# Patient Record
Sex: Female | Born: 1985 | Race: Black or African American | Hispanic: No | Marital: Married | Smoking: Never smoker
Health system: Southern US, Community
[De-identification: ages and names within clinical notes are randomized; demographics above are authoritative.]

## PROBLEM LIST (undated history)

## (undated) ENCOUNTER — Inpatient Hospital Stay (HOSPITAL_COMMUNITY): Payer: Self-pay

## (undated) DIAGNOSIS — F32A Depression, unspecified: Secondary | ICD-10-CM

## (undated) DIAGNOSIS — Z789 Other specified health status: Secondary | ICD-10-CM

## (undated) DIAGNOSIS — F329 Major depressive disorder, single episode, unspecified: Secondary | ICD-10-CM

## (undated) DIAGNOSIS — F419 Anxiety disorder, unspecified: Secondary | ICD-10-CM

## (undated) HISTORY — DX: Depression, unspecified: F32.A

## (undated) HISTORY — DX: Major depressive disorder, single episode, unspecified: F32.9

## (undated) HISTORY — PX: WISDOM TOOTH EXTRACTION: SHX21

## (undated) HISTORY — DX: Anxiety disorder, unspecified: F41.9

---

## 2009-12-06 ENCOUNTER — Emergency Department (HOSPITAL_COMMUNITY): Admission: EM | Admit: 2009-12-06 | Discharge: 2009-12-06 | Payer: Self-pay | Admitting: Emergency Medicine

## 2011-02-25 ENCOUNTER — Emergency Department (HOSPITAL_COMMUNITY)
Admission: EM | Admit: 2011-02-25 | Discharge: 2011-02-25 | Disposition: A | Discharge status: Home / Self Care | Payer: Self-pay | Attending: Emergency Medicine | Admitting: Emergency Medicine

## 2011-02-25 DIAGNOSIS — Z711 Person with feared health complaint in whom no diagnosis is made: Secondary | ICD-10-CM | POA: Insufficient documentation

## 2012-06-05 DIAGNOSIS — IMO0002 Reserved for concepts with insufficient information to code with codable children: Secondary | ICD-10-CM | POA: Insufficient documentation

## 2012-11-08 ENCOUNTER — Encounter (HOSPITAL_COMMUNITY): Payer: Self-pay | Admitting: *Deleted

## 2012-11-08 ENCOUNTER — Inpatient Hospital Stay (HOSPITAL_COMMUNITY)
Admission: AD | Admit: 2012-11-08 | Discharge: 2012-11-08 | Disposition: A | Discharge status: Home / Self Care | Payer: BC Managed Care – PPO | Source: Ambulatory Visit | Attending: Obstetrics & Gynecology | Admitting: Obstetrics & Gynecology

## 2012-11-08 DIAGNOSIS — O1203 Gestational edema, third trimester: Secondary | ICD-10-CM

## 2012-11-08 DIAGNOSIS — IMO0002 Reserved for concepts with insufficient information to code with codable children: Secondary | ICD-10-CM

## 2012-11-08 HISTORY — DX: Other specified health status: Z78.9

## 2012-11-08 LAB — URINALYSIS, ROUTINE W REFLEX MICROSCOPIC
Ketones, ur: NEGATIVE mg/dL
Leukocytes, UA: NEGATIVE
Nitrite: NEGATIVE
Protein, ur: NEGATIVE mg/dL
Urobilinogen, UA: 0.2 mg/dL (ref 0.0–1.0)
pH: 6 (ref 5.0–8.0)

## 2012-11-08 NOTE — MAU Note (Signed)
Pt reports she has had swelling in her feet x 2 weeks, feels like her face is swollen, denies problems with pregnancy

## 2012-11-08 NOTE — Progress Notes (Signed)
Pt states she took medication when she was"a little girl"

## 2012-11-08 NOTE — MAU Provider Note (Signed)
History     CSN: [id number]  Arrival date and time: 11/08/12 2003   None     Chief Complaint  Patient presents with  . Foot Swelling   HPI 27 y/o G1P0 here with facial and LE edema for 2-3 hours. She states that tonight she was in class and was told by a classmate that her face was swollen. She has had LE edema throughout her pregnancy that resolves with putting up her feet. SHe has not had facial edema. She describes it as uniform and feeling like her face was very tight. SHe denies any new foods, makeup, or medications. The swelling has now resolved. Her preg Hx is significant for IUGR and she normally gets her care at [facility]. She gets twice weekly NST's  And BPP q 3 weeks because of the IUGR. She was mostly worried about her baby. SHe denies vaginal bleeding, discharge, LOF, and decreased featal movement. She further denies dyspnea, fever, rash, itching, and chest pain.   OB History   Grav Para Term Preterm Abortions TAB SAB Ect Mult Living   1               Past Medical History  Diagnosis Date  . Medical history non-contributory     Past Surgical History  Procedure Laterality Date  . No past surgeries      Family History  Problem Relation Age of Onset  . Alcohol abuse Neg Hx   . Arthritis Neg Hx   . Asthma Neg Hx   . Birth defects Neg Hx   . Cancer Neg Hx   . COPD Neg Hx   . Depression Neg Hx   . Diabetes Neg Hx   . Drug abuse Neg Hx   . Early death Neg Hx   . Hearing loss Neg Hx   . Heart disease Neg Hx   . Hyperlipidemia Neg Hx   . Hypertension Neg Hx   . Kidney disease Neg Hx   . Learning disabilities Neg Hx   . Mental illness Neg Hx   . Mental retardation Neg Hx   . Miscarriages / Stillbirths Neg Hx   . Stroke Neg Hx   . Vision loss Neg Hx     History  Substance Use Topics  . Smoking status: Not on file  . Smokeless tobacco: Not on file  . Alcohol Use: Not on file    Allergies:  Allergies  Allergen Reactions  .  Doxycycline Itching and Rash    Prescriptions prior to admission  Medication Sig Dispense Refill  . Prenatal Vit-Fe Fumarate-FA (PRENATAL MULTIVITAMIN) TABS Take 1 tablet by mouth daily.        ROS Per HPI Physical Exam   Blood pressure 119/78, pulse 76, temperature 97.6 F (36.4 C), temperature source Oral, resp. rate 18, height 5\' 7"  (1.702 m), weight 86.183 kg (190 lb), SpO2 100.00%.  Physical Exam Gen: NAD, alert, cooperative with exam HEENT: NCAT, EOMI, PERRL CV: RRR, good S1/S2, no murmur Resp: CTABL, no wheezes, non-labored Abd: Soft, pregnant abdomen Ext: trace LE edemaBL Neuro: Alert and oriented, No gross deficits  FHT: BAseline 140, Moderate variability, accels present, No decels Toco: few irregular contractions   MAU Course  Procedures  Results for orders placed during the hospital encounter of 11/08/12 (from the past 24 hour(s))  URINALYSIS, ROUTINE W REFLEX MICROSCOPIC     Status: Abnormal   Collection Time    11/08/12  8:29 PM  Result Value Range   Color, Urine YELLOW  YELLOW   APPearance CLEAR  CLEAR   Specific Gravity, Urine <1.005 (*) 1.005 - 1.030   pH 6.0  5.0 - 8.0   Glucose, UA NEGATIVE  NEGATIVE mg/dL   Hgb urine dipstick NEGATIVE  NEGATIVE   Bilirubin Urine NEGATIVE  NEGATIVE   Ketones, ur NEGATIVE  NEGATIVE mg/dL   Protein, ur NEGATIVE  NEGATIVE mg/dL   Urobilinogen, UA 0.2  0.0 - 1.0 mg/dL   Nitrite NEGATIVE  NEGATIVE   Leukocytes, UA NEGATIVE  NEGATIVE    Assessment and Plan  27 y/o G1P0 here with transient facial and LE swelling - with Normal BP, UA without protein, and improvement of symptoms without intervention she is safe for dc with close f/u as previously scheduled - Consider possible food allergy with transient facial swelling - f/u with her primary OB in [place]   Kevin Fenton 11/08/2012, 10:37 PM

## 2012-11-08 NOTE — MAU Note (Signed)
Pt states she noticed swelling about 1800 pt states she elevated her legs for about and they didn't go down.

## 2012-11-10 NOTE — MAU Provider Note (Signed)
I have seen and examined this patient and agree the above assessment. CRESENZO-DISHMAN,Oni Dietzman 11/10/2012 12:12 PM   

## 2012-11-22 DIAGNOSIS — O1490 Unspecified pre-eclampsia, unspecified trimester: Secondary | ICD-10-CM

## 2013-09-13 ENCOUNTER — Encounter (HOSPITAL_COMMUNITY): Payer: Self-pay | Admitting: *Deleted

## 2014-07-15 ENCOUNTER — Encounter (HOSPITAL_COMMUNITY): Payer: Self-pay | Admitting: *Deleted

## 2015-03-03 DIAGNOSIS — Z98891 History of uterine scar from previous surgery: Secondary | ICD-10-CM | POA: Insufficient documentation

## 2015-03-03 DIAGNOSIS — O09299 Supervision of pregnancy with other poor reproductive or obstetric history, unspecified trimester: Secondary | ICD-10-CM | POA: Insufficient documentation

## 2015-03-03 DIAGNOSIS — Z8619 Personal history of other infectious and parasitic diseases: Secondary | ICD-10-CM | POA: Insufficient documentation

## 2015-03-03 LAB — OB RESULTS CONSOLE HGB/HCT, BLOOD
HEMATOCRIT: 35 %
HEMOGLOBIN: 11.7 g/dL

## 2015-03-03 LAB — OB RESULTS CONSOLE HIV ANTIBODY (ROUTINE TESTING): HIV: NONREACTIVE

## 2015-03-03 LAB — OB RESULTS CONSOLE PLATELET COUNT: Platelets: 251 10*3/uL

## 2015-03-03 LAB — OB RESULTS CONSOLE ABO/RH: RH TYPE: POSITIVE

## 2015-03-03 LAB — OB RESULTS CONSOLE ANTIBODY SCREEN: ANTIBODY SCREEN: NEGATIVE

## 2015-03-03 LAB — OB RESULTS CONSOLE RUBELLA ANTIBODY, IGM: Rubella: IMMUNE

## 2015-03-03 LAB — OB RESULTS CONSOLE RPR: RPR: NONREACTIVE

## 2015-03-03 LAB — OB RESULTS CONSOLE HEPATITIS B SURFACE ANTIGEN: HEP B S AG: NEGATIVE

## 2015-03-24 ENCOUNTER — Encounter: Payer: Self-pay | Admitting: Obstetrics & Gynecology

## 2015-03-24 DIAGNOSIS — Z3480 Encounter for supervision of other normal pregnancy, unspecified trimester: Secondary | ICD-10-CM

## 2015-03-26 LAB — OB RESULTS CONSOLE GC/CHLAMYDIA
Chlamydia: NEGATIVE
Gonorrhea: NEGATIVE

## 2015-07-16 LAB — GLUCOSE TOLERANCE, 1 HOUR: Glucose, GTT - 1 Hour: 80 mg/dL (ref ?–200)

## 2015-07-16 LAB — OB RESULTS CONSOLE PLATELET COUNT: PLATELETS: 197 10*3/uL

## 2015-07-16 LAB — OB RESULTS CONSOLE HGB/HCT, BLOOD
HEMATOCRIT: 36 %
HEMOGLOBIN: 11.8 g/dL

## 2015-07-16 LAB — OB RESULTS CONSOLE HIV ANTIBODY (ROUTINE TESTING): HIV: NONREACTIVE

## 2015-07-16 LAB — OB RESULTS CONSOLE RPR: RPR: NONREACTIVE

## 2015-07-24 DIAGNOSIS — O365990 Maternal care for other known or suspected poor fetal growth, unspecified trimester, not applicable or unspecified: Secondary | ICD-10-CM | POA: Insufficient documentation

## 2015-08-15 ENCOUNTER — Other Ambulatory Visit (HOSPITAL_COMMUNITY): Payer: Self-pay | Admitting: Nurse Practitioner

## 2015-08-15 DIAGNOSIS — Z3A30 30 weeks gestation of pregnancy: Secondary | ICD-10-CM

## 2015-08-15 DIAGNOSIS — IMO0002 Reserved for concepts with insufficient information to code with codable children: Secondary | ICD-10-CM

## 2015-08-19 ENCOUNTER — Other Ambulatory Visit (HOSPITAL_COMMUNITY): Payer: Self-pay | Admitting: *Deleted

## 2015-08-19 DIAGNOSIS — IMO0002 Reserved for concepts with insufficient information to code with codable children: Secondary | ICD-10-CM

## 2015-08-20 ENCOUNTER — Ambulatory Visit (HOSPITAL_COMMUNITY)
Admission: RE | Admit: 2015-08-20 | Discharge: 2015-08-20 | Disposition: A | Discharge status: Home / Self Care | Payer: Medicaid Other | Source: Ambulatory Visit | Attending: Obstetrics and Gynecology | Admitting: Obstetrics and Gynecology

## 2015-08-20 DIAGNOSIS — O09293 Supervision of pregnancy with other poor reproductive or obstetric history, third trimester: Secondary | ICD-10-CM | POA: Diagnosis not present

## 2015-08-20 DIAGNOSIS — O34219 Maternal care for unspecified type scar from previous cesarean delivery: Secondary | ICD-10-CM | POA: Diagnosis not present

## 2015-08-20 DIAGNOSIS — Z3A30 30 weeks gestation of pregnancy: Secondary | ICD-10-CM | POA: Diagnosis not present

## 2015-08-20 DIAGNOSIS — O365930 Maternal care for other known or suspected poor fetal growth, third trimester, not applicable or unspecified: Secondary | ICD-10-CM | POA: Diagnosis not present

## 2015-08-20 MED ORDER — BETAMETHASONE SOD PHOS & ACET 6 (3-3) MG/ML IJ SUSP
12.0000 mg | Freq: Once | INTRAMUSCULAR | Status: AC
  Administered 2015-08-20: 12 mg via INTRAMUSCULAR
  Filled 2015-08-20: qty 2

## 2015-08-21 ENCOUNTER — Other Ambulatory Visit (HOSPITAL_COMMUNITY): Payer: Self-pay | Admitting: Nurse Practitioner

## 2015-08-21 ENCOUNTER — Encounter (HOSPITAL_COMMUNITY): Payer: Self-pay

## 2015-08-21 ENCOUNTER — Other Ambulatory Visit (HOSPITAL_COMMUNITY): Payer: Self-pay | Admitting: Obstetrics and Gynecology

## 2015-08-21 ENCOUNTER — Ambulatory Visit (HOSPITAL_COMMUNITY)
Admission: RE | Admit: 2015-08-21 | Discharge: 2015-08-21 | Disposition: A | Discharge status: Home / Self Care | Payer: Medicaid Other | Source: Ambulatory Visit | Attending: Nurse Practitioner | Admitting: Nurse Practitioner

## 2015-08-21 ENCOUNTER — Other Ambulatory Visit (HOSPITAL_COMMUNITY): Payer: Self-pay

## 2015-08-21 DIAGNOSIS — O34219 Maternal care for unspecified type scar from previous cesarean delivery: Secondary | ICD-10-CM

## 2015-08-21 DIAGNOSIS — IMO0002 Reserved for concepts with insufficient information to code with codable children: Secondary | ICD-10-CM

## 2015-08-21 DIAGNOSIS — O365930 Maternal care for other known or suspected poor fetal growth, third trimester, not applicable or unspecified: Secondary | ICD-10-CM | POA: Diagnosis not present

## 2015-08-21 DIAGNOSIS — Z3A30 30 weeks gestation of pregnancy: Secondary | ICD-10-CM

## 2015-08-21 DIAGNOSIS — O09299 Supervision of pregnancy with other poor reproductive or obstetric history, unspecified trimester: Secondary | ICD-10-CM

## 2015-08-21 DIAGNOSIS — Z8489 Family history of other specified conditions: Secondary | ICD-10-CM

## 2015-08-28 ENCOUNTER — Encounter (HOSPITAL_COMMUNITY): Payer: Self-pay

## 2015-08-28 ENCOUNTER — Ambulatory Visit (HOSPITAL_COMMUNITY)
Admission: RE | Admit: 2015-08-28 | Discharge: 2015-08-28 | Disposition: A | Discharge status: Home / Self Care | Payer: Medicaid Other | Source: Ambulatory Visit | Attending: Nurse Practitioner | Admitting: Nurse Practitioner

## 2015-08-28 ENCOUNTER — Other Ambulatory Visit (HOSPITAL_COMMUNITY): Payer: Self-pay | Admitting: Maternal and Fetal Medicine

## 2015-08-28 DIAGNOSIS — O09299 Supervision of pregnancy with other poor reproductive or obstetric history, unspecified trimester: Secondary | ICD-10-CM

## 2015-08-28 DIAGNOSIS — Z3A31 31 weeks gestation of pregnancy: Secondary | ICD-10-CM

## 2015-08-28 DIAGNOSIS — O365930 Maternal care for other known or suspected poor fetal growth, third trimester, not applicable or unspecified: Secondary | ICD-10-CM | POA: Diagnosis present

## 2015-08-28 DIAGNOSIS — IMO0002 Reserved for concepts with insufficient information to code with codable children: Secondary | ICD-10-CM

## 2015-08-28 DIAGNOSIS — Z8489 Family history of other specified conditions: Secondary | ICD-10-CM

## 2015-08-28 DIAGNOSIS — Z8759 Personal history of other complications of pregnancy, childbirth and the puerperium: Secondary | ICD-10-CM

## 2015-08-28 DIAGNOSIS — O34219 Maternal care for unspecified type scar from previous cesarean delivery: Secondary | ICD-10-CM

## 2015-08-28 DIAGNOSIS — O09293 Supervision of pregnancy with other poor reproductive or obstetric history, third trimester: Secondary | ICD-10-CM | POA: Insufficient documentation

## 2015-09-04 ENCOUNTER — Other Ambulatory Visit (HOSPITAL_COMMUNITY): Payer: Self-pay | Admitting: Maternal and Fetal Medicine

## 2015-09-04 ENCOUNTER — Ambulatory Visit (HOSPITAL_COMMUNITY)
Admission: RE | Admit: 2015-09-04 | Discharge: 2015-09-04 | Disposition: A | Discharge status: Home / Self Care | Payer: Medicaid Other | Source: Ambulatory Visit | Attending: Nurse Practitioner | Admitting: Nurse Practitioner

## 2015-09-04 ENCOUNTER — Encounter (HOSPITAL_COMMUNITY): Payer: Self-pay

## 2015-09-04 DIAGNOSIS — O365930 Maternal care for other known or suspected poor fetal growth, third trimester, not applicable or unspecified: Secondary | ICD-10-CM

## 2015-09-04 DIAGNOSIS — O09293 Supervision of pregnancy with other poor reproductive or obstetric history, third trimester: Secondary | ICD-10-CM | POA: Insufficient documentation

## 2015-09-04 DIAGNOSIS — IMO0002 Reserved for concepts with insufficient information to code with codable children: Secondary | ICD-10-CM

## 2015-09-04 DIAGNOSIS — Z3A32 32 weeks gestation of pregnancy: Secondary | ICD-10-CM | POA: Diagnosis not present

## 2015-09-04 DIAGNOSIS — O34219 Maternal care for unspecified type scar from previous cesarean delivery: Secondary | ICD-10-CM

## 2015-09-04 DIAGNOSIS — Z8489 Family history of other specified conditions: Secondary | ICD-10-CM

## 2015-09-11 ENCOUNTER — Encounter (HOSPITAL_COMMUNITY): Payer: Self-pay

## 2015-09-11 ENCOUNTER — Ambulatory Visit (HOSPITAL_COMMUNITY)
Admission: RE | Admit: 2015-09-11 | Discharge: 2015-09-11 | Disposition: A | Discharge status: Home / Self Care | Payer: Medicaid Other | Source: Ambulatory Visit | Attending: Obstetrics & Gynecology | Admitting: Obstetrics & Gynecology

## 2015-09-11 ENCOUNTER — Other Ambulatory Visit (HOSPITAL_COMMUNITY): Payer: Self-pay | Admitting: Maternal and Fetal Medicine

## 2015-09-11 DIAGNOSIS — O09293 Supervision of pregnancy with other poor reproductive or obstetric history, third trimester: Secondary | ICD-10-CM

## 2015-09-11 DIAGNOSIS — Z3A33 33 weeks gestation of pregnancy: Secondary | ICD-10-CM | POA: Diagnosis not present

## 2015-09-11 DIAGNOSIS — Z8489 Family history of other specified conditions: Secondary | ICD-10-CM

## 2015-09-11 DIAGNOSIS — O34219 Maternal care for unspecified type scar from previous cesarean delivery: Secondary | ICD-10-CM | POA: Diagnosis not present

## 2015-09-11 DIAGNOSIS — IMO0002 Reserved for concepts with insufficient information to code with codable children: Secondary | ICD-10-CM

## 2015-09-11 DIAGNOSIS — O365930 Maternal care for other known or suspected poor fetal growth, third trimester, not applicable or unspecified: Secondary | ICD-10-CM | POA: Insufficient documentation

## 2015-09-14 NOTE — L&D Delivery Note (Signed)
Delivery Note At  a non-viable unspecified sex was delivered via vaginal delivery.  Infant was vertex with a hand presentation.  APGAR: 0, 0. Weight: not weighted yet.   Placenta status: placenta intact. No evidence of abruption.  Cord: infarcted.  Indeterminate number of vessels.  Cord pH: N/A  Anesthesia:  epidural Episiotomy:  none Lacerations:  none Est. Blood Loss (mL):  <50cc. Minimal blood loss with delivery of the infant.  No fluid noted at the time of delivery.  Mom to women's unit.  Baby to morgue after time with mother.Marland Kitchen  HARRAWAY-SMITH, Bellamy Rubey 09/30/2015, 6:49 PM

## 2015-09-18 ENCOUNTER — Ambulatory Visit (HOSPITAL_COMMUNITY): Payer: Medicaid Other

## 2015-09-24 ENCOUNTER — Encounter: Payer: Self-pay | Admitting: *Deleted

## 2015-09-24 DIAGNOSIS — Z8659 Personal history of other mental and behavioral disorders: Secondary | ICD-10-CM

## 2015-09-24 DIAGNOSIS — Z8759 Personal history of other complications of pregnancy, childbirth and the puerperium: Principal | ICD-10-CM

## 2015-09-24 NOTE — Progress Notes (Signed)
Chart abstracted. [facility]

## 2015-09-25 ENCOUNTER — Ambulatory Visit (HOSPITAL_COMMUNITY)
Admission: RE | Admit: 2015-09-25 | Discharge: 2015-09-25 | Disposition: A | Discharge status: Home / Self Care | Payer: BC Managed Care – PPO | Source: Ambulatory Visit | Attending: Obstetrics & Gynecology | Admitting: Obstetrics & Gynecology

## 2015-09-25 ENCOUNTER — Encounter (HOSPITAL_COMMUNITY): Payer: Self-pay

## 2015-09-25 ENCOUNTER — Encounter: Payer: Self-pay | Admitting: Family Medicine

## 2015-09-25 ENCOUNTER — Encounter: Payer: Self-pay | Admitting: Obstetrics & Gynecology

## 2015-09-25 ENCOUNTER — Ambulatory Visit (INDEPENDENT_AMBULATORY_CARE_PROVIDER_SITE_OTHER): Payer: Medicaid Other | Admitting: Obstetrics & Gynecology

## 2015-09-25 VITALS — BP 115/72 | HR 80 | Temp 98.1°F | Ht 66.0 in | Wt 188.0 lb

## 2015-09-25 DIAGNOSIS — O365930 Maternal care for other known or suspected poor fetal growth, third trimester, not applicable or unspecified: Secondary | ICD-10-CM

## 2015-09-25 DIAGNOSIS — O0990 Supervision of high risk pregnancy, unspecified, unspecified trimester: Secondary | ICD-10-CM | POA: Insufficient documentation

## 2015-09-25 DIAGNOSIS — Z3A35 35 weeks gestation of pregnancy: Secondary | ICD-10-CM | POA: Diagnosis not present

## 2015-09-25 DIAGNOSIS — IMO0002 Reserved for concepts with insufficient information to code with codable children: Secondary | ICD-10-CM

## 2015-09-25 DIAGNOSIS — O09293 Supervision of pregnancy with other poor reproductive or obstetric history, third trimester: Secondary | ICD-10-CM | POA: Insufficient documentation

## 2015-09-25 DIAGNOSIS — O0993 Supervision of high risk pregnancy, unspecified, third trimester: Secondary | ICD-10-CM

## 2015-09-25 DIAGNOSIS — O34219 Maternal care for unspecified type scar from previous cesarean delivery: Secondary | ICD-10-CM | POA: Insufficient documentation

## 2015-09-25 LAB — POCT URINALYSIS DIP (DEVICE)
Bilirubin Urine: NEGATIVE
Glucose, UA: NEGATIVE mg/dL
Hgb urine dipstick: NEGATIVE
Ketones, ur: 15 mg/dL — AB
NITRITE: NEGATIVE
PH: 5.5 (ref 5.0–8.0)
PROTEIN: NEGATIVE mg/dL
Specific Gravity, Urine: 1.02 (ref 1.005–1.030)
Urobilinogen, UA: 0.2 mg/dL (ref 0.0–1.0)
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
## 2015-09-25 NOTE — Patient Instructions (Signed)
Vaginal Birth After Cesarean Delivery  Vaginal birth after cesarean delivery (VBAC) is giving birth vaginally after previously delivering a baby by a cesarean. In the past, if a woman had a cesarean delivery, all births afterward would be done by cesarean delivery. This is no longer true. It can be safe for the mother to try a vaginal delivery after having a cesarean delivery.   It is important to discuss VBAC with your health care provider early in the pregnancy so you can understand the risks, benefits, and options. It will give you time to decide what is best in your particular case. The final decision about whether to have a VBAC or repeat cesarean delivery should be between you and your health care provider. Any changes in your health or your baby's health during your pregnancy may make it necessary to change your initial decision about VBAC.   WOMEN WHO PLAN TO HAVE A VBAC SHOULD CHECK WITH THEIR HEALTH CARE PROVIDER TO BE SURE THAT:  · The previous cesarean delivery was done with a low transverse uterine cut (incision) (not a vertical classical incision).    · The birth canal is big enough for the baby.    · There were no other operations on the uterus.    · An electronic fetal monitor (EFM) will be on at all times during labor.    · An operating room will be available and ready in case an emergency cesarean delivery is needed.    · A health care provider and surgical nursing staff will be available at all times during labor to be ready to do an emergency delivery cesarean if necessary.    · An anesthesiologist will be present in case an emergency cesarean delivery is needed.    · The nursery is prepared and has adequate personnel and necessary equipment available to care for the baby in case of an emergency cesarean delivery.  BENEFITS OF VBAC  · Shorter stay in the hospital.    · Avoidance of risks associated with cesarean delivery, such as:    Surgical complications, such as opening of the incision or  hernia in the incision.    Injury to other organs.    Fever. This can occur if an infection develops after surgery. It can also occur as a reaction to the medicine given to make you numb during the surgery.  · Less blood loss and need for blood transfusions.  · Lower risk of blood clots and infection.   · Shorter recovery.    · Decreased risk for having to remove the uterus (hysterectomy).    · Decreased risk for the placenta to completely or partially cover the opening of the uterus (placenta previa) with a future pregnancy.    · Decrease risk in future labor and delivery.  RISKS OF A VBAC  · Tearing (rupture) of the uterus. This is occurs in less than 1% of VBACs. The risk of this happening is higher if:    Steps are taken to begin the labor process (induce labor) or stimulate or strengthen contractions (augment labor).      Medicine is used to soften (ripen) the cervix.  · Having to remove the uterus (hysterectomy) if it ruptures.  VBAC SHOULD NOT BE DONE IF:  · The previous cesarean delivery was done with a vertical (classical) or T-shaped incision or you do not know what kind of incision was made.    · You had a ruptured uterus.    · You have had certain types of surgery on your uterus, such as removal of uterine fibroids.   Ask your health care provider about other types of surgeries that prevent you from having a VBAC.  · You have certain medical or childbirth (obstetrical) problems.    · There are problems with the baby.    · You have had two previous cesarean deliveries and no vaginal deliveries.  OTHER FACTS TO KNOW ABOUT VBAC:  · It is safe to have an epidural anesthetic with VBAC.    · It is safe to turn the baby from a breech position (attempt an external cephalic version).    · It is safe to try a VBAC with twins.    · VBAC may not be successful if your baby weights 8.8 lb (4 kg) or more. However, weight predictions are not always accurate and should not be used alone to decide if VBAC is right for  you.  · There is an increased failure rate if the time between the cesarean delivery and VBAC is less than 19 months.    · Your health care provider may advise against a VBAC if you have preeclampsia (high blood pressure, protein in the urine, and swelling of face and extremities).    · VBAC is often successful if you previously gave birth vaginally.    · VBAC is often successful when the labor starts spontaneously before the due date.    · Delivering a baby through a VBAC is similar to having a normal spontaneous vaginal delivery.     This information is not intended to replace advice given to you by your health care provider. Make sure you discuss any questions you have with your health care provider.     Document Released: 02/20/2007 Document Revised: 09/20/2014 Document Reviewed: 03/29/2013  Elsevier Interactive Patient Education ©2016 Elsevier Inc.

## 2015-09-26 ENCOUNTER — Encounter: Payer: Self-pay | Admitting: *Deleted

## 2015-09-26 ENCOUNTER — Telehealth: Payer: Self-pay | Admitting: *Deleted

## 2015-09-26 DIAGNOSIS — O0990 Supervision of high risk pregnancy, unspecified, unspecified trimester: Secondary | ICD-10-CM

## 2015-09-26 DIAGNOSIS — Z8659 Personal history of other mental and behavioral disorders: Secondary | ICD-10-CM

## 2015-09-26 DIAGNOSIS — Z8759 Personal history of other complications of pregnancy, childbirth and the puerperium: Principal | ICD-10-CM

## 2015-09-26 DIAGNOSIS — Z820 Family history of epilepsy and other diseases of the nervous system: Secondary | ICD-10-CM | POA: Insufficient documentation

## 2015-09-26 NOTE — Telephone Encounter (Signed)
Pt left message stating that she was @ clinic yesterday and saw Dr. Debroah LoopArnold. He told her that she could see midwife for prenatal care but when she called [place] office to schedule appt, she was told she could not be seen there. She is confused and would like a call back. I called pt back and discussed that she has a high risk pregnancy and that may be the reason she cannot be [place] @ [place]. I further stated that we have a midwife in our clinic on the days of her next 3 weekly US @ [facility]. She may see the midwife in our office on that Rachel Savage. She will need to see a doctor @ her 2/9 visit as there is no midwife in clinic that Rachel Savage. Pt was very appreciative and agreed to the plan for appts as stated. She requested to see a female provider @ her visit on 2/9 and I stated that her request would be honored. [facility] registrar to schedule appts.

## 2015-09-28 ENCOUNTER — Inpatient Hospital Stay (HOSPITAL_COMMUNITY)
Admission: AD | Admit: 2015-09-28 | Discharge: 2015-10-02 | DRG: 774 | Disposition: A | Discharge status: Home / Self Care | Payer: BC Managed Care – PPO | Source: Ambulatory Visit | Attending: Family Medicine | Admitting: Family Medicine

## 2015-09-28 ENCOUNTER — Encounter (HOSPITAL_COMMUNITY): Payer: Self-pay

## 2015-09-28 ENCOUNTER — Inpatient Hospital Stay (HOSPITAL_COMMUNITY): Payer: BC Managed Care – PPO

## 2015-09-28 DIAGNOSIS — O322XX0 Maternal care for transverse and oblique lie, not applicable or unspecified: Secondary | ICD-10-CM | POA: Diagnosis present

## 2015-09-28 DIAGNOSIS — O364XX0 Maternal care for intrauterine death, not applicable or unspecified: Secondary | ICD-10-CM | POA: Diagnosis present

## 2015-09-28 DIAGNOSIS — O34219 Maternal care for unspecified type scar from previous cesarean delivery: Secondary | ICD-10-CM | POA: Diagnosis present

## 2015-09-28 DIAGNOSIS — Z833 Family history of diabetes mellitus: Secondary | ICD-10-CM | POA: Diagnosis not present

## 2015-09-28 DIAGNOSIS — O09299 Supervision of pregnancy with other poor reproductive or obstetric history, unspecified trimester: Secondary | ICD-10-CM

## 2015-09-28 DIAGNOSIS — Z3A36 36 weeks gestation of pregnancy: Secondary | ICD-10-CM

## 2015-09-28 DIAGNOSIS — Z8659 Personal history of other mental and behavioral disorders: Secondary | ICD-10-CM

## 2015-09-28 DIAGNOSIS — Z3A35 35 weeks gestation of pregnancy: Secondary | ICD-10-CM

## 2015-09-28 DIAGNOSIS — O368190 Decreased fetal movements, unspecified trimester, not applicable or unspecified: Secondary | ICD-10-CM

## 2015-09-28 DIAGNOSIS — O365990 Maternal care for other known or suspected poor fetal growth, unspecified trimester, not applicable or unspecified: Secondary | ICD-10-CM | POA: Diagnosis present

## 2015-09-28 DIAGNOSIS — IMO0002 Reserved for concepts with insufficient information to code with codable children: Secondary | ICD-10-CM | POA: Diagnosis present

## 2015-09-28 DIAGNOSIS — Z8249 Family history of ischemic heart disease and other diseases of the circulatory system: Secondary | ICD-10-CM

## 2015-09-28 DIAGNOSIS — Z8759 Personal history of other complications of pregnancy, childbirth and the puerperium: Secondary | ICD-10-CM

## 2015-09-28 DIAGNOSIS — Z98891 History of uterine scar from previous surgery: Secondary | ICD-10-CM

## 2015-09-28 DIAGNOSIS — Z820 Family history of epilepsy and other diseases of the nervous system: Secondary | ICD-10-CM

## 2015-09-28 LAB — CBC
HEMATOCRIT: 36.8 % (ref 36.0–46.0)
Hemoglobin: 12.7 g/dL (ref 12.0–15.0)
MCH: 28.9 pg (ref 26.0–34.0)
MCHC: 34.5 g/dL (ref 30.0–36.0)
MCV: 83.6 fL (ref 78.0–100.0)
PLATELETS: 166 10*3/uL (ref 150–400)
RBC: 4.4 MIL/uL (ref 3.87–5.11)
RDW: 12.3 % (ref 11.5–15.5)
WBC: 5.4 10*3/uL (ref 4.0–10.5)

## 2015-09-28 LAB — KLEIHAUER-BETKE STAIN
# VIALS RHIG: 1
FETAL CELLS %: 0 %
QUANTITATION FETAL HEMOGLOBIN: 0 mL

## 2015-09-28 LAB — TYPE AND SCREEN
ABO/RH(D): O POS
Antibody Screen: NEGATIVE

## 2015-09-28 LAB — D-DIMER, QUANTITATIVE (NOT AT ARMC): D DIMER QUANT: 4.3 ug{FEU}/mL — AB (ref 0.00–0.50)

## 2015-09-28 MED ORDER — CITRIC ACID-SODIUM CITRATE 334-500 MG/5ML PO SOLN: 30.0000 mL | ORAL | Status: DC | PRN

## 2015-09-28 MED ORDER — OXYCODONE-ACETAMINOPHEN 5-325 MG PO TABS: 1.0000 | ORAL_TABLET | ORAL | Status: DC | PRN

## 2015-09-28 MED ORDER — LIDOCAINE HCL (PF) 1 % IJ SOLN: 30.0000 mL | INTRAMUSCULAR | Status: DC | PRN

## 2015-09-28 MED ORDER — SODIUM CHLORIDE 0.9 % IJ SOLN: 3.0000 mL | INTRAMUSCULAR | Status: DC | PRN

## 2015-09-28 MED ORDER — OXYTOCIN 10 UNIT/ML IJ SOLN: 10.0000 [IU] | Freq: Once | INTRAMUSCULAR | Status: DC

## 2015-09-28 MED ORDER — ONDANSETRON HCL 4 MG/2ML IJ SOLN
4.0000 mg | Freq: Four times a day (QID) | INTRAMUSCULAR | Status: DC | PRN
  Administered 2015-09-29 – 2015-09-30 (×2): 4 mg via INTRAVENOUS
  Filled 2015-09-28 (×2): qty 2

## 2015-09-28 MED ORDER — FENTANYL CITRATE (PF) 100 MCG/2ML IJ SOLN: 50.0000 ug | INTRAMUSCULAR | Status: DC | PRN

## 2015-09-28 MED ORDER — SODIUM CHLORIDE 0.9 % IV SOLN: 250.0000 mL | INTRAVENOUS | Status: DC | PRN

## 2015-09-28 MED ORDER — SODIUM CHLORIDE 0.9 % IJ SOLN: 3.0000 mL | Freq: Two times a day (BID) | INTRAMUSCULAR | Status: DC

## 2015-09-28 MED ORDER — OXYTOCIN 10 UNIT/ML IJ SOLN: 2.5000 [IU]/h | INTRAVENOUS | Status: DC

## 2015-09-28 MED ORDER — HYDROXYZINE HCL 50 MG PO TABS: 50.0000 mg | ORAL_TABLET | Freq: Four times a day (QID) | ORAL | Status: DC | PRN

## 2015-09-28 MED ORDER — MISOPROSTOL 50MCG HALF TABLET
50.0000 ug | ORAL_TABLET | ORAL | Status: DC
  Administered 2015-09-28 – 2015-09-30 (×7): 50 ug via ORAL
  Filled 2015-09-28 (×7): qty 0.5

## 2015-09-28 MED ORDER — ACETAMINOPHEN 325 MG PO TABS: 650.0000 mg | ORAL_TABLET | ORAL | Status: DC | PRN

## 2015-09-28 MED ORDER — LACTATED RINGERS IV SOLN
500.0000 mL | INTRAVENOUS | Status: DC | PRN
  Administered 2015-09-30: 500 mL via INTRAVENOUS

## 2015-09-28 MED ORDER — OXYCODONE-ACETAMINOPHEN 5-325 MG PO TABS: 2.0000 | ORAL_TABLET | ORAL | Status: DC | PRN

## 2015-09-28 MED ORDER — LACTATED RINGERS IV SOLN
INTRAVENOUS | Status: DC
  Administered 2015-09-30: 125 mL/h via INTRAVENOUS

## 2015-09-28 MED ORDER — OXYTOCIN BOLUS FROM INFUSION
500.0000 mL | INTRAVENOUS | Status: DC
  Administered 2015-09-30: 500 mL via INTRAVENOUS

## 2015-09-28 NOTE — H&P (Signed)
Rachel Savage is a 30 y.o. female G3P0111 @ 35.5 wks [facility] presenting for IUFD. Maternal Medical History:  Reason for admission: IUFD  Contractions: none  Prenatal complications: no prenatal complications Prenatal Complications - Diabetes: none.    OB History    Gravida Para Term Preterm AB TAB SAB Ectopic Multiple Living   3 1 0 1 1 0 1 0 0 1      Past Medical History  Diagnosis Date  . Medical history non-contributory   . Depression    Past Surgical History  Procedure Laterality Date  . Cesarean section    . Wisdom tooth extraction     Family History: family history includes Cancer in her maternal uncle; Diabetes in her maternal grandmother; Hypertension in her father and maternal aunt; Muscular dystrophy in her maternal uncle. Social History:  reports that she has never smoked. She has never used smokeless tobacco. She reports that she does not drink alcohol or use illicit drugs.   Prenatal Transfer Tool  Maternal Diabetes: No Genetic Screening: Normal Maternal Ultrasounds/Referrals: Normal Fetal Ultrasounds or other Referrals:  None Maternal Substance Abuse:  No Significant Maternal Medications:  None Significant Maternal Lab Results:  None Other Comments:  None  Review of Systems  Constitutional: Negative.   HENT: Negative.   Eyes: Negative.   Respiratory: Negative.   Cardiovascular: Negative.   Gastrointestinal: Negative.   Genitourinary: Negative.   Musculoskeletal: Negative.   Skin: Negative.   Neurological: Negative.   Endo/Heme/Allergies: Negative.   Psychiatric/Behavioral: Negative.     Dilation: Closed Exam by:: darlene lawson cnm Blood pressure 113/74, temperature 98.1 F (36.7 C), temperature source Oral, resp. rate 18, height 5\' 6"  (1.676 m), weight 188 lb (85.276 kg), last menstrual period 01/21/2015, unknown if currently breastfeeding. Maternal Exam:  Uterine Assessment: No contractions  Abdomen: Patient reports  no abdominal tenderness. Introitus: Normal vulva. Pelvis: adequate for delivery.   Cervix: Cervix evaluated by digital exam.     Fetal Exam Fetal Monitor Review: IUFD     Physical Exam  Constitutional: She is oriented to person, place, and time. She appears well-developed and well-nourished.  HENT:  Head: Normocephalic.  Neck: Normal range of motion.  Cardiovascular: Normal rate, regular rhythm, normal heart sounds and intact distal pulses.   Respiratory: Effort normal and breath sounds normal.  GI: Soft.  Genitourinary: Vagina normal.  Musculoskeletal: Normal range of motion.  Neurological: She is alert and oriented to person, place, and time. She has normal reflexes.  Skin: Skin is warm and dry.  Psychiatric: She has a normal mood and affect. Her behavior is normal. Judgment and thought content normal.    Prenatal labs: ABO, Rh: O/Positive/-- (06/20 0000) Antibody: Negative (06/20 0000) Rubella: Immune (06/20 0000) RPR: Nonreactive (11/02 0000)  HBsAg: Negative (06/20 0000)  HIV: Non-reactive (11/02 0000)  GBS:     Assessment/Plan: IUFD at 35.5, SVE cl/th/post/high PLAN:  cutotec induction of labor  LAWSON, MARIE DARLENE 09/28/2015, 7:16 PM

## 2015-09-29 LAB — RPR: RPR Ser Ql: NONREACTIVE

## 2015-09-29 LAB — ABO/RH: ABO/RH(D): O POS

## 2015-09-29 MED ORDER — ZOLPIDEM TARTRATE 5 MG PO TABS
5.0000 mg | ORAL_TABLET | Freq: Once | ORAL | Status: AC
  Administered 2015-09-29: 5 mg via ORAL
  Filled 2015-09-29: qty 1

## 2015-09-29 MED ORDER — ZOLPIDEM TARTRATE 5 MG PO TABS
5.0000 mg | ORAL_TABLET | Freq: Every evening | ORAL | Status: DC | PRN
  Administered 2015-09-29 – 2015-10-01 (×2): 5 mg via ORAL
  Filled 2015-09-29 (×2): qty 1

## 2015-09-29 MED ORDER — FENTANYL CITRATE (PF) 100 MCG/2ML IJ SOLN
100.0000 ug | INTRAMUSCULAR | Status: DC | PRN
  Administered 2015-09-30: 100 ug via INTRAVENOUS
  Filled 2015-09-29: qty 2

## 2015-09-29 NOTE — Progress Notes (Signed)
Labor Progress Note Rachel Savage is a 30 y.o. [phone] at 733w6d presented for IUFD S: Patient is feeling irregular contractions  O:  BP 106/76 mmHg  Pulse 71  Temp(Src) 98.2 F (36.8 C) (Oral)  Resp 16  Ht 5\' 6"  (1.676 m)  Wt 188 lb (85.276 kg)  BMI 30.36 kg/m2  LMP 01/21/2015 EFM: N/A  CVE: Dilation: 1 Station: -3 Presentation: Vertex Exam by:: MD Newton   A&P: 30 y.o. [id number] 2733w6d IOL for IUFD #Labor: Cytotec q4 hours. Patient does not want FB. PLan for pitocin when favorable.  #Pain: prn meds #FWB: NA #GBS: NA  Federico FlakeKimberly Niles Newton, MD 5:37 PM

## 2015-09-29 NOTE — Progress Notes (Signed)
Initial visit with Ms Rachel Savage upon referral from the night chaplain, Darcus Pesteramela Holder.  Ms Rachel Savage is appropriately grieving the loss of her baby, Rachel Savage.    She shared that she had a dream in which God told her Rachel Savage would be a good name for a child and then two weeks later she was pregnant.  She said she learned more about the name and learned that it means God will increase and that in the Bible Rachel Savage named her son Rachel Savage and said "God has taken away my reproach".  She knew this was a sign that she had come to the other side of so many painful parts of her past and that this child was a sign of her redemption.  She shared the ways that Rachel Savage's life, in her pregnancy, has already changed her so much and drawn her so close to God.    Ms Rachel Savage is displaying a full range of emotions and using her resources well.  She has reached out to work Acupuncturistcolleagues and friends for prayer and been touched by the way they've gone above and beyond to love and care for her.  She knows she needs to use the time her job will give her to take care of herself.  She plans to visit her "spiritual aunt" who has also left a bad marriage and had a still birth and knows this woman will be a blessing to her.    I excused myself when Ms Rachel Savage's boss came to visit and will continue to follow up throughout the day  Please page as further needs arise.  Maryanna ShapeAmanda M. Carley Hammedavee Lomax, M.Div. [facility] Chaplain Pager [phone] Office [phone]       09/29/15 [phone]  Clinical Encounter Type  Visited With Patient  Visit Type Initial;Spiritual support;Death  Referral From Chaplain  Consult/Referral To Chaplain  Spiritual Encounters  Spiritual Needs Emotional;Grief support  Stress Factors  Patient Stress Factors Loss
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
## 2015-09-29 NOTE — Progress Notes (Signed)
Follow up visit with patient who was sleeping.  Pt stated she is starting to feel a little uncomfortable, but has been able to rest.  Notified her that we have support around the clock and that we will transition to the night chaplain shortly and that I will notify evening chaplain, Billey GoslingCharlie Lumpkin of her circumstances and that we will continue to follow up with her tomorrow.  Please page as further needs arise.  Maryanna ShapeAmanda M. Carley Hammedavee Lomax, M.Div. [facility] Chaplain Pager [phone] Office [phone]      09/29/15 1508  Clinical Encounter Type  Visited With Patient  Visit Type Follow-up  Referral From Chaplain  Consult/Referral To Chaplain  Spiritual Encounters  Spiritual Needs Grief support

## 2015-09-29 NOTE — Progress Notes (Addendum)
Pt's nurse and friend/co-worker were bedside when I arrived. Pt was praying in her prayer language and interpreting it as thanking God for allowing her to carry this baby. She prayed for God to give him life; that she would give little Jomarie LongsJoseph back to him. She resolved to the will of God to accept to [facility]Joseph [facility] and that God would take care of him. When pt completed her prayer, another nurse was present along w/physician who explained her options to deliver. She chose to begin delivery process immediately. CH provided bedside emotional and spiritual support. During the time of visit, pt wants prayer and baptism for infant remains after delivery. Please page if additional support is needed prior to or after delivery.  Chaplain Marjory Liesamela Carrington Holder, MDiv   09/29/15 0100  Clinical Encounter Type  Visited With Patient and family together

## 2015-09-29 NOTE — Progress Notes (Signed)
Security notified that pt does not want her husband Henreitta LeberBrandon Nahm to visit her without being called first. Chaplain paged to bedside per pt request.

## 2015-09-30 ENCOUNTER — Inpatient Hospital Stay (HOSPITAL_COMMUNITY): Payer: BC Managed Care – PPO | Admitting: Anesthesiology

## 2015-09-30 ENCOUNTER — Encounter (HOSPITAL_COMMUNITY): Payer: Self-pay | Admitting: Anesthesiology

## 2015-09-30 DIAGNOSIS — O364XX0 Maternal care for intrauterine death, not applicable or unspecified: Secondary | ICD-10-CM

## 2015-09-30 DIAGNOSIS — Z3A35 35 weeks gestation of pregnancy: Secondary | ICD-10-CM

## 2015-09-30 LAB — CBC
HEMATOCRIT: 39.5 % (ref 36.0–46.0)
Hemoglobin: 13.4 g/dL (ref 12.0–15.0)
MCH: 28.4 pg (ref 26.0–34.0)
MCHC: 33.9 g/dL (ref 30.0–36.0)
MCV: 83.7 fL (ref 78.0–100.0)
PLATELETS: 175 10*3/uL (ref 150–400)
RBC: 4.72 MIL/uL (ref 3.87–5.11)
RDW: 12.4 % (ref 11.5–15.5)
WBC: 6.9 10*3/uL (ref 4.0–10.5)

## 2015-09-30 LAB — DIC (DISSEMINATED INTRAVASCULAR COAGULATION) PANEL
PLATELETS: 172 10*3/uL (ref 150–400)
SMEAR REVIEW: NONE SEEN

## 2015-09-30 LAB — DIC (DISSEMINATED INTRAVASCULAR COAGULATION)PANEL
D-Dimer, Quant: 8.55 ug/mL-FEU — ABNORMAL HIGH (ref 0.00–0.50)
Fibrinogen: 538 mg/dL — ABNORMAL HIGH (ref 204–475)
INR: 1.12 (ref 0.00–1.49)
Prothrombin Time: 14.6 seconds (ref 11.6–15.2)
aPTT: 29 seconds (ref 24–37)

## 2015-09-30 MED ORDER — PROMETHAZINE HCL 25 MG/ML IJ SOLN
12.5000 mg | Freq: Four times a day (QID) | INTRAMUSCULAR | Status: DC | PRN
Start: 2015-09-30 — End: 2015-09-30
  Administered 2015-09-30: 12.5 mg via INTRAVENOUS
  Filled 2015-09-30: qty 1

## 2015-09-30 MED ORDER — FENTANYL 2.5 MCG/ML BUPIVACAINE 1/10 % EPIDURAL INFUSION (WH - ANES)
14.0000 mL/h | INTRAMUSCULAR | Status: DC | PRN
  Administered 2015-09-30: 14 mL/h via EPIDURAL
  Filled 2015-09-30: qty 125

## 2015-09-30 MED ORDER — EPHEDRINE 5 MG/ML INJ: 10.0000 mg | INTRAVENOUS | Status: DC | PRN

## 2015-09-30 MED ORDER — FENTANYL CITRATE (PF) 100 MCG/2ML IJ SOLN
INTRAMUSCULAR | Status: AC
  Filled 2015-09-30: qty 2

## 2015-09-30 MED ORDER — FENTANYL CITRATE (PF) 250 MCG/5ML IJ SOLN
INTRAMUSCULAR | Status: DC | PRN
  Administered 2015-09-30: 100 ug via INTRAVENOUS

## 2015-09-30 MED ORDER — PHENYLEPHRINE 40 MCG/ML (10ML) SYRINGE FOR IV PUSH (FOR BLOOD PRESSURE SUPPORT)
80.0000 ug | PREFILLED_SYRINGE | INTRAVENOUS | Status: DC | PRN
  Filled 2015-09-30: qty 20

## 2015-09-30 MED ORDER — OXYTOCIN 10 UNIT/ML IJ SOLN
1.0000 m[IU]/min | INTRAVENOUS | Status: DC
  Filled 2015-09-30: qty 4

## 2015-09-30 MED ORDER — LIDOCAINE HCL (PF) 1 % IJ SOLN
INTRAMUSCULAR | Status: DC | PRN
  Administered 2015-09-30: 8 mL via EPIDURAL

## 2015-09-30 MED ORDER — BUTORPHANOL TARTRATE 1 MG/ML IJ SOLN
2.0000 mg | Freq: Once | INTRAMUSCULAR | Status: AC
  Administered 2015-09-30: 2 mg via INTRAVENOUS
  Filled 2015-09-30: qty 2

## 2015-09-30 MED ORDER — OXYTOCIN 10 UNIT/ML IJ SOLN
1.0000 m[IU]/min | INTRAVENOUS | Status: DC
  Administered 2015-09-30: 2 m[IU]/min via INTRAVENOUS

## 2015-09-30 MED ORDER — MIDAZOLAM HCL 2 MG/2ML IJ SOLN
INTRAMUSCULAR | Status: DC | PRN
  Administered 2015-09-30: 2 mg via INTRAVENOUS

## 2015-09-30 MED ORDER — DIPHENHYDRAMINE HCL 50 MG/ML IJ SOLN
12.5000 mg | INTRAMUSCULAR | Status: DC | PRN
  Administered 2015-09-30: 12.5 mg via INTRAVENOUS
  Filled 2015-09-30: qty 1

## 2015-09-30 MED ORDER — TERBUTALINE SULFATE 1 MG/ML IJ SOLN
0.2500 mg | Freq: Once | INTRAMUSCULAR | Status: DC | PRN
Start: 2015-09-30 — End: 2015-09-30

## 2015-09-30 MED ORDER — MIDAZOLAM HCL 2 MG/2ML IJ SOLN
INTRAMUSCULAR | Status: AC
  Filled 2015-09-30: qty 2

## 2015-09-30 NOTE — Progress Notes (Addendum)
CSW acknowledges consult request.  L&D RN contacted CSW to follow up with consult request.  L&D reported that the patient presents with concerns on how to talk with her younger daughter, and had questions about burial costs.  CSW noted that the Chaplain has been closely working with the patient, and informed RN that the Chaplain will be addressing these concerns.  L&D RN unable to identify additional needs that CSW would be able to address.   CSW left message with Chaplain to share RN concerns, and will follow up with patient if Chaplain identifies additional needs.  Update: CSW collaborated with Chaplain in order to inquire about need for CSW assessment.   Chaplain stated that she will continue to remain in contact with CSW, and CSW will be able to follow up after she delivers. Contact CSW if needs arise prior to delivery.

## 2015-09-30 NOTE — Anesthesia Procedure Notes (Signed)
Epidural Patient location during procedure: OB Start time: 09/30/2015 2:56 PM End time: 09/30/2015 3:00 PM  Staffing Anesthesiologist: Leilani Able Performed by: anesthesiologist   Preanesthetic Checklist Completed: patient identified, surgical consent, pre-op evaluation, timeout performed, IV checked, risks and benefits discussed and monitors and equipment checked  Epidural Patient position: sitting Prep: site prepped and draped and DuraPrep Patient monitoring: continuous pulse ox and blood pressure Approach: midline Location: L3-L4 Injection technique: LOR air  Needle:  Needle type: Tuohy  Needle gauge: 17 G Needle length: 9 cm and 9 Needle insertion depth: 5 cm cm Catheter type: closed end flexible Catheter size: 19 Gauge Catheter at skin depth: 10 cm Test dose: negative and Other  Assessment Sensory level: T9 Events: blood not aspirated, injection not painful, no injection resistance, negative IV test and no paresthesia  Additional Notes Reason for block:procedure for pain
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
## 2015-09-30 NOTE — Anesthesia Preprocedure Evaluation (Signed)
Anesthesia Evaluation  Patient identified by MRN, date of birth, ID band Patient awake    Reviewed: Allergy & Precautions, H&P , NPO status , Patient's Chart, lab work & pertinent test results  Airway Mallampati: II  TM Distance: >3 FB Neck ROM: full    Dental no notable dental hx.    Pulmonary neg pulmonary ROS,    Pulmonary exam normal       Cardiovascular negative cardio ROS Normal cardiovascular exam    Neuro/Psych negative neurological ROS     GI/Hepatic negative GI ROS, Neg liver ROS,   Endo/Other  negative endocrine ROS  Renal/GU negative Renal ROS     Musculoskeletal   Abdominal (+) + obese,   Peds  Hematology negative hematology ROS (+)   Anesthesia Other Findings   Reproductive/Obstetrics (+) Pregnancy                             Anesthesia Physical Anesthesia Plan  ASA: II  Anesthesia Plan: Epidural   Post-op Pain Management:    Induction:   Airway Management Planned:   Additional Equipment:   Intra-op Plan:   Post-operative Plan:   Informed Consent: I have reviewed the patients History and Physical, chart, labs and discussed the procedure including the risks, benefits and alternatives for the proposed anesthesia with the patient or authorized representative who has indicated his/her understanding and acceptance.     Plan Discussed with:   Anesthesia Plan Comments:         Anesthesia Quick Evaluation  

## 2015-09-30 NOTE — Progress Notes (Signed)
Rachel Savage is a 30 y.o. [id number] at [redacted]w[redacted]d by LMP admitted for induction of labor due to IUFD.  Subjective:   Objective: BP 114/72 mmHg  Pulse 78  Temp(Src) 98.1 F (36.7 C) (Oral)  Resp 16  Ht  (1.676 m)  Wt 188 lb (85.276 kg)  BMI 30.36 kg/m2  SpO2 93%  LMP 01/21/2015      FHT: absent UC:   regular, every 2-3 minutes SVE:   Dilation: 3 Effacement (%): 90 Station: -3 Exam by:: MD Shawnie Pons AROM clear fluid  Labs: Lab Results  Component Value Date   WBC 6.9 09/30/2015   HGB 13.4 09/30/2015   HCT 39.5 09/30/2015   MCV 83.7 09/30/2015   PLT 175 09/30/2015    Assessment / Plan: Induction of labor due to fetal demise,  progressing well on pitocin  Labor: Progressing normally Preeclampsia:  no signs or symptoms of toxicity Fetal Wellbeing:  IUFD Pain Control:  Epidural I/D:  n/a Anticipated MOD:  NSVD  Rachel Savage S 09/30/2015, 4:44 PM

## 2015-09-30 NOTE — Progress Notes (Signed)
   Chaplain paged at [phone]. When chaplain arrived Ms Bondar requested that Agnes Lawrence visit her as soon as possible. She did not wish to talk to any other chaplain. Night chaplain asked if there was anything specific Ms Cataldi wished to pass on to Lillyana Majette Schwab. Ms Gaugh said there was nothing to pass on, other than to request Chaplain Tomasita Morrow come as soon as possible.  Please page chaplain should Ms Neitzke need or request spiritual or emotional care prior to daytime chaplain visit.  Benjie Karvonen. Phillips Goulette, DMin, MDiv, MA Chaplain Pager [phone]      09/30/15 1855  Clinical Encounter Type  Visited With Patient  Visit Type Follow-up  Referral From Page  Consult/Referral To Chaplain  Spiritual Encounters  Spiritual Needs Grief support

## 2015-09-30 NOTE — Progress Notes (Signed)
Labor Progress Note Rachel Savage is a 30 y.o. [id number] at [redacted]w[redacted]d presented for IUFD S: Patient is feeling regular strong contractions.   O:  BP 115/75 mmHg  Pulse 87  Temp(Src) 98 F (36.7 C) (Axillary)  Resp 16  Ht  (1.676 m)  Wt 188 lb (85.276 kg)  BMI 30.36 kg/m2  LMP 01/21/2015  CVE: Dilation: 1.5 Effacement (%): 70 Cervical Position: Middle Station: -3 Presentation: Vertex Exam by:: Dr. Alvester Morin   A&P: 30 y.o. [id number] [redacted]w[redacted]d IOL for IUFD #Labor: Cytotec q4 hours. Patient does not want FB.  Contracting too frequently for cytotec and now s/p 7 doses.  Consider redose vs pitocin in 6 hours.  #Pain: prn meds. Will give Stadol , Phenergan 12.5mg  now to assist with pain and sleep.  Encouraged patient to think about epidural in future.   #Diet: allow to eat b-fast before starting pitocin.   Federico Flake, MD 4:41 AM

## 2015-10-01 ENCOUNTER — Encounter (HOSPITAL_COMMUNITY): Payer: Self-pay | Admitting: *Deleted

## 2015-10-01 DIAGNOSIS — IMO0002 Reserved for concepts with insufficient information to code with codable children: Secondary | ICD-10-CM | POA: Diagnosis present

## 2015-10-01 MED ORDER — ACETAMINOPHEN 325 MG PO TABS
650.0000 mg | ORAL_TABLET | ORAL | Status: DC | PRN
  Administered 2015-10-01: 650 mg via ORAL
  Filled 2015-10-01: qty 2

## 2015-10-01 MED ORDER — BENZOCAINE-MENTHOL 20-0.5 % EX AERO
1.0000 "application " | INHALATION_SPRAY | CUTANEOUS | Status: DC | PRN
  Filled 2015-10-01: qty 56

## 2015-10-01 MED ORDER — SIMETHICONE 80 MG PO CHEW: 80.0000 mg | CHEWABLE_TABLET | ORAL | Status: DC | PRN

## 2015-10-01 MED ORDER — PRENATAL MULTIVITAMIN CH: 1.0000 | ORAL_TABLET | Freq: Every day | ORAL | Status: DC

## 2015-10-01 MED ORDER — IBUPROFEN 600 MG PO TABS
600.0000 mg | ORAL_TABLET | Freq: Four times a day (QID) | ORAL | Status: DC
  Administered 2015-10-01 – 2015-10-02 (×6): 600 mg via ORAL
  Filled 2015-10-01 (×6): qty 1

## 2015-10-01 MED ORDER — DIBUCAINE 1 % RE OINT
1.0000 | TOPICAL_OINTMENT | RECTAL | Status: DC | PRN
Start: 2015-10-01 — End: 2015-10-02
  Filled 2015-10-01: qty 28

## 2015-10-01 MED ORDER — LANOLIN HYDROUS EX OINT: TOPICAL_OINTMENT | CUTANEOUS | Status: DC | PRN

## 2015-10-01 MED ORDER — ONDANSETRON HCL 4 MG PO TABS: 4.0000 mg | ORAL_TABLET | ORAL | Status: DC | PRN

## 2015-10-01 MED ORDER — DIPHENHYDRAMINE HCL 25 MG PO CAPS: 25.0000 mg | ORAL_CAPSULE | Freq: Four times a day (QID) | ORAL | Status: DC | PRN

## 2015-10-01 MED ORDER — TETANUS-DIPHTH-ACELL PERTUSSIS 5-2.5-18.5 LF-MCG/0.5 IM SUSP: 0.5000 mL | Freq: Once | INTRAMUSCULAR | Status: DC

## 2015-10-01 MED ORDER — PROMETHAZINE HCL 25 MG/ML IJ SOLN: 12.5000 mg | Freq: Four times a day (QID) | INTRAMUSCULAR | Status: DC | PRN

## 2015-10-01 MED ORDER — ONDANSETRON HCL 4 MG/2ML IJ SOLN: 4.0000 mg | INTRAMUSCULAR | Status: DC | PRN

## 2015-10-01 MED ORDER — WITCH HAZEL-GLYCERIN EX PADS
1.0000 | MEDICATED_PAD | CUTANEOUS | Status: DC | PRN
Start: 2015-10-01 — End: 2015-10-02

## 2015-10-01 MED ORDER — SENNOSIDES-DOCUSATE SODIUM 8.6-50 MG PO TABS
2.0000 | ORAL_TABLET | ORAL | Status: DC
  Administered 2015-10-01: 2 via ORAL
  Filled 2015-10-01: qty 2

## 2015-10-01 NOTE — Progress Notes (Signed)
This is a late entry note from yesterday with additions from my work with patient today.  Yesterday, I helped to support Rachel Savage as she labored and waited for her epidural to be placed.    I then supported Rachel Savage this morning and throughout the day (for a total of over 4 hours of time with patient) providing emotional and spiritual support as she continued to process her loss and her role as a mother.    I helped to facilitate FOB seeing his son, Rachel Savage, while also protecting Rachel Savage from having to see FOB at her request.  I offered support to her friends as well and each of them has my card for follow up support.    I referred her to Glenetta Hew with Lactation if she runs into any difficulty over the next several days related to lactation.  She expressed an interest in donating some breast milk.  She also has support from her own pastor who will be doing the funeral service at their church.    We will continue to offer support throughout her time here, please page as needs arise.  Chaplain Dyanne Carrel, Bcc Pager, [phone] 4:53 PM    10/01/15 1600  Clinical Encounter Type  Visited With Patient  Visit Type Spiritual support  Referral From Chaplain  Spiritual Encounters  Spiritual Needs Grief support;Emotional  Stress Factors  Patient Stress Factors Loss

## 2015-10-01 NOTE — Progress Notes (Signed)
Post Partum Day 1 Subjective: no complaints, voiding and tolerating PO  Objective: Blood pressure 100/66, pulse 81, temperature 98.6 F (37 C), temperature source Oral, resp. rate 16, height  (1.676 m), weight 188 lb (85.276 kg), last menstrual period 01/21/2015, SpO2 93 %, unknown if currently breastfeeding.  Physical Exam:  General: alert, cooperative, appears stated age and sad and tearful Lochia: appropriate Uterine Fundus: firm DVT Evaluation: No evidence of DVT seen on physical exam.   Recent Labs  09/28/15 1955 09/30/15 1041  HGB 12.7 13.4  HCT 36.8 39.5    Assessment/Plan: Plan for discharge tomorrow  Routine pp care   LOS: 3 days   Ivor Kishi S 10/01/2015, 6:53 AM

## 2015-10-01 NOTE — Anesthesia Postprocedure Evaluation (Signed)
Anesthesia Post Note  Patient: Rachel Savage  Procedure(s) Performed: * No procedures listed *  Patient location during evaluation: [facility] Anesthesia Type: Epidural Level of consciousness: awake, awake and alert, oriented and patient cooperative Pain management: pain level controlled Vital Signs Assessment: post-procedure vital signs reviewed and stable Respiratory status: spontaneous breathing, nonlabored ventilation and respiratory function stable Cardiovascular status: stable Postop Assessment: no headache, no backache, patient able to bend at knees and no signs of nausea or vomiting Anesthetic complications: no    Last Vitals:  Filed Vitals:   10/01/15 0026 10/01/15 0745  BP: 100/66 107/65  Pulse: 81 76  Temp:  36.4 C  Resp:  18    Last Pain:  Filed Vitals:   10/01/15 0805  PainSc: 0-No pain                 Nadja Lina L

## 2015-10-01 NOTE — Progress Notes (Signed)
Chaplain paged at [phone]. Nurse was told that at an earlier page Rachel Savage did not wish to talk with a female chaplain. Nurse gained consent from Rachel Savage for female chaplain to come. There appears to be no further issues at this time with the gender of the chaplain. Chaplain arrived at or about 2250.  Rachel Savage continues to have spiritual questions over the death of her son, Rachel Savage. This visit was a continuation of other chaplain visits that care for her in her grief. Rachel Savage still wishes to speak with a daytime chaplain to continue to process and deal with the great grief she is experiencing. Please page a chaplain if Rachel Savage wishes for or needs further spiritual care.  Rachel Savage. Rachel Savage, DMin, MDiv, MA Chaplain Pager [phone]     10/01/15 0011  Clinical Encounter Type  Visited With Patient  Visit Type Follow-up  Referral From Page  Consult/Referral To Chaplain  Spiritual Encounters  Spiritual Needs Discernment, Spiritual support and continued Grief support

## 2015-10-02 ENCOUNTER — Ambulatory Visit (HOSPITAL_COMMUNITY): Payer: Medicaid Other

## 2015-10-02 MED ORDER — IBUPROFEN 600 MG PO TABS: 600.0000 mg | ORAL_TABLET | Freq: Four times a day (QID) | ORAL | Status: AC

## 2015-10-02 MED ORDER — ZOLPIDEM TARTRATE 5 MG PO TABS: 5.0000 mg | ORAL_TABLET | Freq: Every evening | ORAL | Status: DC | PRN

## 2015-10-02 NOTE — Progress Notes (Signed)
Chaplain returned call, stated will be in in 20-30 minutes.
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
## 2015-10-02 NOTE — Progress Notes (Signed)
Follow up visit with Rachel Savage, whom I've been working with since Monday.  She requested that I visit her before she is discharged today.  She expressed her fear in leaving knowing that everything will be real at that point.  She's felt somewhat protected in the hospital because the staff know what she is going through and have been treating her tenderly because of it.  When she leaves, she'll have to share the news with others.  In addition, she is working on her plan to leave her spouse.  I supported Rachel Savage in considering her resources and exploring others that might be available to her.  She owned the struggle of asking for help and I encouraged her to be mindful how it feels to bless others and to remember that they are happy to know of a specific way that they can assist her.  I also encouraged her to consider her daughter as she resists assistance, remembering that even though it's hard to receive help for oneself, sometimes it's easier to do so knowing that it's in the best interest of your child.    I gave Rachel Savage a blessing per her request.  She has my contact information and was encouraged to follow up as she feels led.    Please page as further needs arise.  Maryanna Shape. Carley Hammed, M.Div. [facility] Chaplain Pager [phone] Office [phone]        10/02/15 1237  Clinical Encounter Type  Visited With Patient and family together  Visit Type Follow-up;Spiritual support;Social support  Referral From Chaplain  Consult/Referral To Chaplain  Spiritual Encounters  Spiritual Needs Prayer;Ritual;Emotional;Grief support  Stress Factors  Patient Stress Factors Loss

## 2015-10-02 NOTE — Lactation Note (Signed)
Lactation Consultation Note  Visit with patient prior to discharge.  She has expressed desire to pump and donate breast milk after the loss of her son.  She was provided with comfort packet which included information regarding milk donation.  She also has the phone number of comfort team lactation consultant.  [employer] pump loaner completed.  Instructed on how to return pump.  Patient Name: Lorrie Gargan [id number] Date: 10/02/2015     Maternal Data    Feeding    LATCH Score/Interventions                      Lactation Tools Discussed/Used     Consult Status      Huston Foley 10/02/2015, 1:36 PM

## 2015-10-02 NOTE — Progress Notes (Signed)
Discharge instructions provided to patient at bedside.  Activity, medications, follow up appointments, when to call the doctor and community resources discussed.  No questions at this time.  Patient left unit in stable condition with all personal belongings, prescriptions and comfort packet, accompanied by staff.  Osvaldo Angst, RN----------------------

## 2015-10-02 NOTE — Discharge Instructions (Signed)
Your  team was Turner Daniels- Doctor, works in [place] Zerita Boers- midwife Lyndel Safe- Doctor, works at [employer] Candelaria Celeste-  Doctor, works at [employer] Anette Riedel [employer]- Doctor, works at [facility] Tinnie Gens- Doctor, works at [employer]- Doctor, works at [employer]==>She delivered your baby   Postpartum Care After Vaginal Delivery After you deliver (postpartum period), the usual stay in the hospital is 24-72 hours. If there were problems with your labor or delivery, or if you have other medical problems, you might be in the hospital longer.  While you are in the hospital, you will receive help and instructions on how to care for yourself during the postpartum period.  While you are in the hospital:  Be sure to tell your nurses if you have pain or discomfort, as well as where you feel the pain and what makes the pain worse.  If you had an incision made near your vagina (episiotomy) or if you had some tearing during delivery, the nurses may put ice packs on your episiotomy or tear. The ice packs may help to reduce the pain and swelling.  If you are breastfeeding, you may feel uncomfortable contractions of your uterus for a couple of weeks. This is normal. The contractions help your uterus get back to normal size.  It is normal to have some bleeding after delivery.  For the first 1-3 days after delivery, the flow is red and the amount may be similar to a period.  It is common for the flow to start and stop.  In the first few days, you may pass some small clots. Let your nurses know if you begin to pass large clots or your flow increases.  Do not  flush blood clots down the toilet before having the nurse look at them.  During the next 3-10 days after delivery, your flow should become more watery and pink or brown-tinged in color.  Ten to fourteen days after delivery, your flow should be a  small amount of yellowish-white discharge.  The amount of your flow will decrease over the first few weeks after delivery. Your flow may stop in 6-8 weeks. Most women have had their flow stop by 12 weeks after delivery.  You should change your sanitary pads frequently.  Wash your hands thoroughly with soap and water for at least 20 seconds after changing pads or using the toilet.  You should feel like you need to empty your bladder within the first 6-8 hours after delivery.  In case you become weak, lightheaded, or faint, call your nurse before you get out of bed for the first time and before you take a shower for the first time.  Within the first few days after delivery, your breasts may begin to feel tender and full. This is called engorgement. Breast tenderness usually goes away within 48-72 hours after engorgement occurs. You may also notice milk leaking from your breasts. If you are not breastfeeding, do not stimulate your breasts. Breast stimulation can make your breasts produce more milk.  Your hormones change after delivery. Sometimes the hormone changes can temporarily cause you to feel sad or tearful. These feelings should not last more than a few days. If these feelings last longer than that, you should talk to your caregiver.  If desired, talk to your caregiver about methods of family planning or contraception.  Talk to your caregiver about immunizations. Your caregiver may want you to have the following  immunizations before leaving the hospital:  Tetanus, diphtheria, and pertussis (Tdap) or tetanus and diphtheria (Td) immunization.  Rubella immunization.  Varicella (chickenpox) immunization.  Influenza immunization. You should receive this annual immunization if you did not receive the immunization during your pregnancy.   This information is not intended to replace advice given to you by your health care provider. Make sure you discuss any questions you have with your health  care provider.   Document Released: 06/27/2007 Document Revised: 05/24/2012 Document Reviewed: 04/26/2012 Elsevier Interactive Patient Education [employer]

## 2015-10-02 NOTE — Progress Notes (Signed)
Crying, requested chaplain to be notified.  Chaplain paged.
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
## 2015-10-02 NOTE — Lactation Note (Signed)
Lactation Consultation Note Mom requesting information about pumping and donating milk to a milk bank. Printed off papers from [employer] for comfort care with the loss of a baby and milk banks. Mom was sleeping so I didn't disturb her. I left the papers with her nurse and told her to call for [facility] to see her before discharged. Patient Name: Rachel Savage [id number] Date: 10/02/2015     Maternal Data    Feeding    LATCH Score/Interventions                      Lactation Tools Discussed/Used     Consult Status      Annaleise Burger, Diamond Nickel 10/02/2015, 3:29 AM
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
## 2015-10-02 NOTE — Progress Notes (Signed)
Chaplain paged at [phone] and arrived at [phone].  [name] spoke of the life changing experience that has occurred while she has been hospitalized at [facility]). The support and care she was provided during the process of dealing with the fetal demise of her son Jomarie Longs has given her a new perspective on life. Her grief is profound, but not crippling in large part because of the patient centered approach the staff of [facility] provided.  Chaplain visit dealt with the issue of boundaries, balance and spiritual growth. [name] has chosen a funeral home, made arrangements of a funeral, and has set a goal of self care physically, mentally and spiritually. This she admits will be difficult at times, but credits the staff with helping her better prepare for such times.  Spiritually [name] has talked with all of the chaplains assigned to [facility] and thanks each of them for their loving and gracious care. She asks for their prayers and for their blessings as she prepares to be discharged from [facility].  Chaplain follow up by daytime chaplain is suggested as [name] requests a blessing from that chaplain.  Rachel Savage. Rachel Savage, DMin, MDiv, MA Chaplain

## 2015-10-09 ENCOUNTER — Ambulatory Visit (HOSPITAL_COMMUNITY): Payer: Medicaid Other

## 2015-10-16 ENCOUNTER — Ambulatory Visit (HOSPITAL_COMMUNITY): Payer: Medicaid Other

## 2015-10-23 ENCOUNTER — Ambulatory Visit (HOSPITAL_COMMUNITY): Payer: Medicaid Other

## 2015-11-03 ENCOUNTER — Ambulatory Visit: Payer: BC Managed Care – PPO | Admitting: Family Medicine

## 2015-12-19 ENCOUNTER — Encounter: Payer: Self-pay | Admitting: Family Medicine

## 2016-01-05 ENCOUNTER — Encounter: Payer: Self-pay | Admitting: Obstetrics & Gynecology

## 2016-01-05 ENCOUNTER — Ambulatory Visit (INDEPENDENT_AMBULATORY_CARE_PROVIDER_SITE_OTHER): Payer: BC Managed Care – PPO | Admitting: Obstetrics & Gynecology

## 2016-01-05 VITALS — BP 124/80 | HR 69 | Wt 187.9 lb

## 2016-01-05 DIAGNOSIS — F53 Postpartum depression: Secondary | ICD-10-CM

## 2016-01-05 DIAGNOSIS — O99345 Other mental disorders complicating the puerperium: Principal | ICD-10-CM

## 2016-01-05 NOTE — Patient Instructions (Signed)
Postpartum Depression and Baby Blues °The postpartum period begins right after the birth of a baby. During this time, there is often a great amount of joy and excitement. It is also a time of many changes in the life of the parents. Regardless of how many times a mother gives birth, each child brings new challenges and dynamics to the family. It is not unusual to have feelings of excitement along with confusing shifts in moods, emotions, and thoughts. All mothers are at risk of developing postpartum depression or the "baby blues." These mood changes can occur right after giving birth, or they may occur many months after giving birth. The baby blues or postpartum depression can be mild or severe. Additionally, postpartum depression can go away rather quickly, or it can be a long-term condition.  °CAUSES °Raised hormone levels and the rapid drop in those levels are thought to be a main cause of postpartum depression and the baby blues. A number of hormones change during and after pregnancy. Estrogen and progesterone usually decrease right after the delivery of your baby. The levels of thyroid hormone and various cortisol steroids also rapidly drop. Other factors that play a role in these mood changes include major life events and genetics.  °RISK FACTORS °If you have any of the following risks for the baby blues or postpartum depression, know what symptoms to watch out for during the postpartum period. Risk factors that may increase the likelihood of getting the baby blues or postpartum depression include: °· Having a personal or family history of depression.   °· Having depression while being pregnant.   °· Having premenstrual mood issues or mood issues related to oral contraceptives. °· Having a lot of life stress.   °· Having marital conflict.   °· Lacking a social support network.   °· Having a baby with special needs.   °· Having health problems, such as diabetes.   °SIGNS AND SYMPTOMS °Symptoms of baby blues  include: °· Brief changes in mood, such as going from extreme happiness to sadness. °· Decreased concentration.   °· Difficulty sleeping.   °· Crying spells, tearfulness.   °· Irritability.   °· Anxiety.   °Symptoms of postpartum depression typically begin within the first month after giving birth. These symptoms include: °· Difficulty sleeping or excessive sleepiness.   °· Marked weight loss.   °· Agitation.   °· Feelings of worthlessness.   °· Lack of interest in activity or food.   °Postpartum psychosis is a very serious condition and can be dangerous. Fortunately, it is rare. Displaying any of the following symptoms is cause for immediate medical attention. Symptoms of postpartum psychosis include:  °· Hallucinations and delusions.   °· Bizarre or disorganized behavior.   °· Confusion or disorientation.   °DIAGNOSIS  °A diagnosis is made by an evaluation of your symptoms. There are no medical or lab tests that lead to a diagnosis, but there are various questionnaires that a health care provider may use to identify those with the baby blues, postpartum depression, or psychosis. Often, a screening tool called the Edinburgh Postnatal Depression Scale is used to diagnose depression in the postpartum period.  °TREATMENT °The baby blues usually goes away on its own in 1-2 weeks. Social support is often all that is needed. You will be encouraged to get adequate sleep and rest. Occasionally, you may be given medicines to help you sleep.  °Postpartum depression requires treatment because it can last several months or longer if it is not treated. Treatment may include individual or group therapy, medicine, or both to address any social, physiological, and psychological   factors that may play a role in the depression. Regular exercise, a healthy diet, rest, and social support may also be strongly recommended.  °Postpartum psychosis is more serious and needs treatment right away. Hospitalization is often needed. °HOME CARE  INSTRUCTIONS °· Get as much rest as you can. Nap when the baby sleeps.   °· Exercise regularly. Some women find yoga and walking to be beneficial.   °· Eat a balanced and nourishing diet.   °· Do little things that you enjoy. Have a cup of tea, take a bubble bath, read your favorite magazine, or listen to your favorite music. °· Avoid alcohol.   °· Ask for help with household chores, cooking, grocery shopping, or running errands as needed. Do not try to do everything.   °· Talk to people close to you about how you are feeling. Get support from your partner, family members, friends, or other new moms. °· Try to stay positive in how you think. Think about the things you are grateful for.   °· Do not spend a lot of time alone.   °· Only take over-the-counter or prescription medicine as directed by your health care provider. °· Keep all your postpartum appointments.   °· Let your health care provider know if you have any concerns.   °SEEK MEDICAL CARE IF: °You are having a reaction to or problems with your medicine. °SEEK IMMEDIATE MEDICAL CARE IF: °· You have suicidal feelings.   °· You think you may harm the baby or someone else. °MAKE SURE YOU: °· Understand these instructions. °· Will watch your condition. °· Will get help right away if you are not doing well or get worse. °  °This information is not intended to replace advice given to you by your health care provider. Make sure you discuss any questions you have with your health care provider. °  °Document Released: 06/03/2004 Document Revised: 09/04/2013 Document Reviewed: 06/11/2013 °Elsevier Interactive Patient Education ©2016 [employer] ° °

## 2016-01-05 NOTE — Progress Notes (Signed)
Given pt info to Dr. Thedore MinsMojeed Akintayo for management of depression.

## 2016-01-05 NOTE — Progress Notes (Signed)
Patient ID: Rachel Savage, female   DOB: 05/05/1986, 30 y.o.   MRN: [id number] Subjective:s/p fetal demise 09/2015     Rachel Savage is a 30 y.o. female who presents for a postpartum visit. She is 3 months postpartum following a spontaneous vaginal delivery. I have fully reviewed the prenatal and intrapartum course. The delivery was at 35 gestational weeks. Outcome: spontaneous vaginal delivery. Anesthesia:  Postpartum course has been complicated by grief and depression.Bowel function is normal. Bladder function is normal. Patient is not sexually active. Contraception method is abstinence. Postpartum depression screening: positive.  The following portions of the patient's history were reviewed and updated as appropriate: allergies, current medications, past family history, past medical history, past social history, past surgical history and problem list.  Review of Systems Pertinent items are noted in HPI.   Objective:    BP 124/80 mmHg  Pulse 69  Wt 187 lb 14.4 oz (85.231 kg)  LMP 01/04/2016  Breastfeeding? No  General:  alert, cooperative and tearful       Assessment:     3 mo postpartum exam after IUFD and postpartum depression. Pap smear not done at today's visit.   Plan:    1. Contraception: abstinence 2. Grief and psychiatric resources given, f/u with primary care. Zoloft 50 mg daily prescribed  Adam PhenixJames G Wilhemina Grall, MD 01/05/2016

## 2016-01-08 MED ORDER — SERTRALINE HCL 50 MG PO TABS: 50.0000 mg | ORAL_TABLET | Freq: Every day | ORAL | Status: DC

## 2016-01-08 NOTE — Addendum Note (Signed)
Addended by: Sherre LainASH, AMANDA A on: 01/08/2016 01:12 PM   Modules accepted: Orders

## 2016-01-09 ENCOUNTER — Telehealth: Payer: Self-pay | Admitting: *Deleted

## 2016-01-09 NOTE — Telephone Encounter (Signed)
Pt left message stating that she wants a letter stating that she has PP depression. She requests this for job protection.

## 2016-01-14 ENCOUNTER — Encounter: Payer: Self-pay | Admitting: *Deleted

## 2016-01-14 NOTE — Telephone Encounter (Signed)
Rachel Savage called back to front desk and call transferred to nurse.  She still wants to know about getting a letter for work about her postpartum depression.  Per chart review was documented by Dr. Debroah LoopArnold so I told her she could  Pick up letter tomorrow or after.  She also c/o intermittent throbbing pain in her vagina.  States is new issue. Per chart had vaginal delivery in January- no stitches or tears.  I informed her Rachel CahillSusan Savage , registar will call and schedule her An appointment to be seen to evaluate pain.    She also wanted to know results of her pathology of her placenta to see if that caused her baby to be stillborn. I informed her I would send message to Dr. Erin Savage to review Pathology and we would call her back. She voices understanding.

## 2016-01-15 NOTE — Telephone Encounter (Signed)
Placental pathology normal.

## 2016-01-19 NOTE — Telephone Encounter (Signed)
Called patient and informed her of normal results. Patient verbalized understanding & had no questions

## 2016-01-24 IMAGING — US US MFM UA CORD DOPPLER
1 series · 13 of 28 positions shown · non-contrast
Comparison: none

[Series 1: us mfm ua cord doppler · 46 acquisitions, 13 frames shown]
[im 2/46]
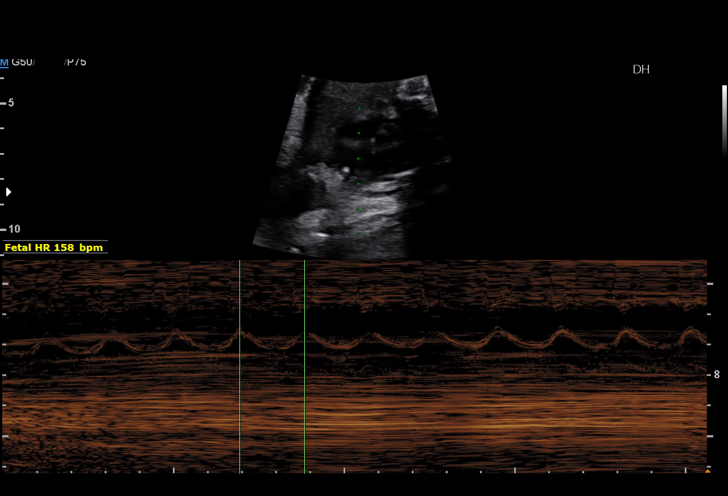
[im 6/46]
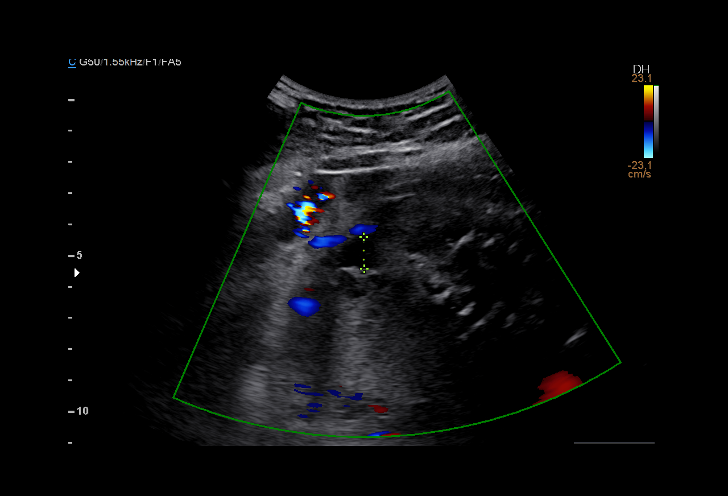
[im 9/46]
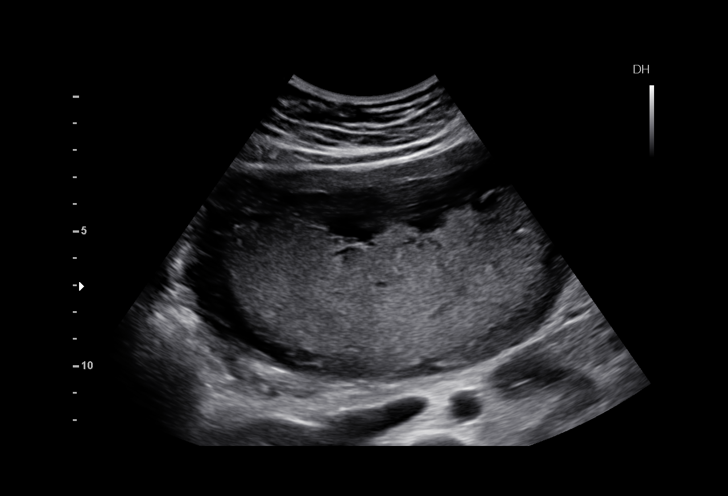
[im 12/46]
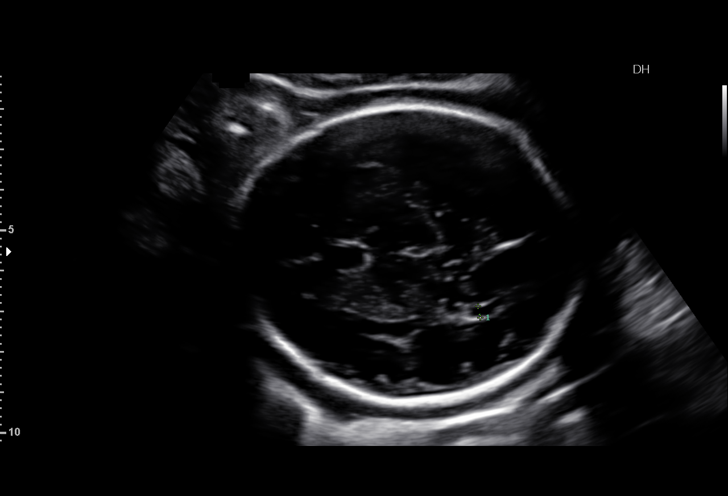
[im 16/46]
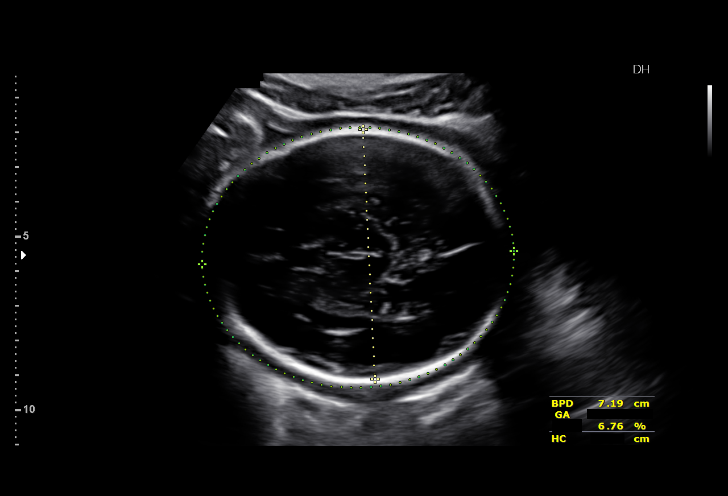
[im 19/46]
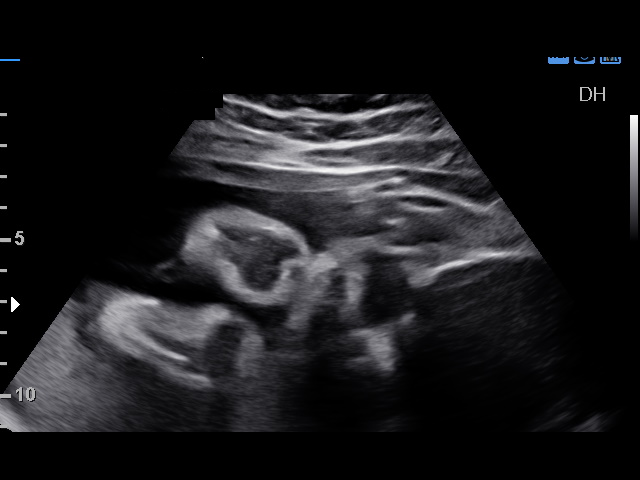
[im 24/46]
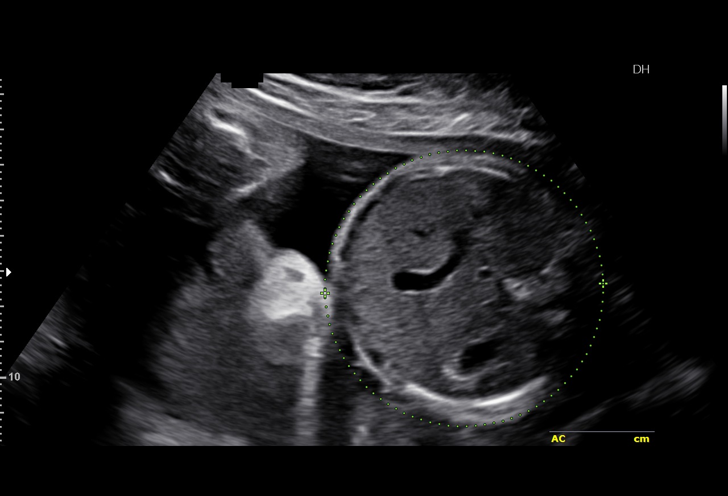
[im 27/46]
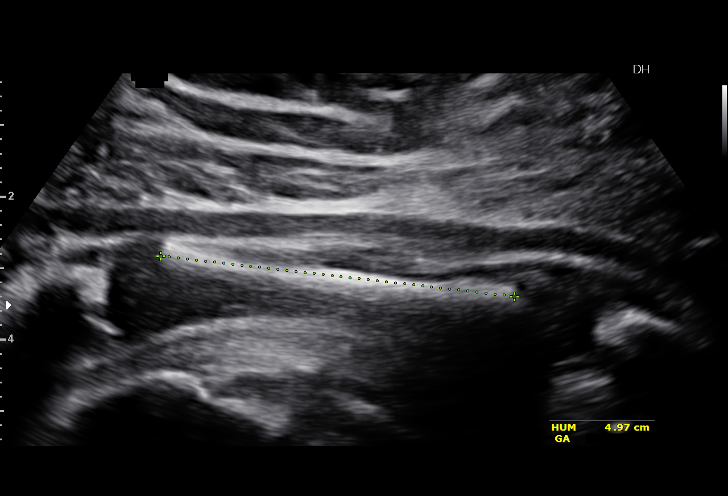
[im 31/46]
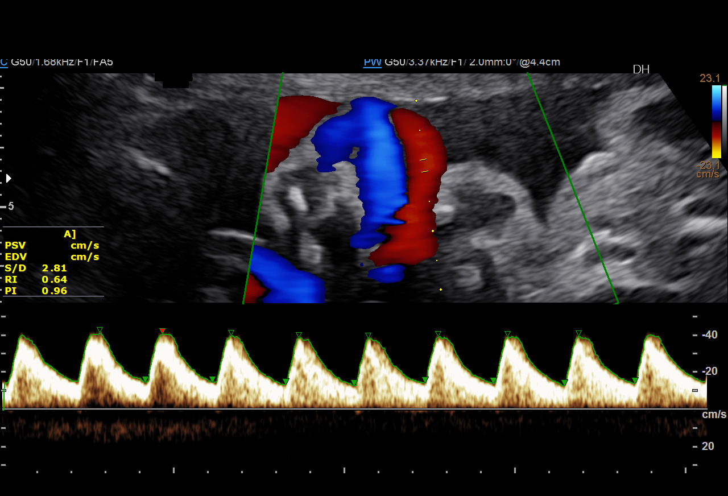
[im 34/46]
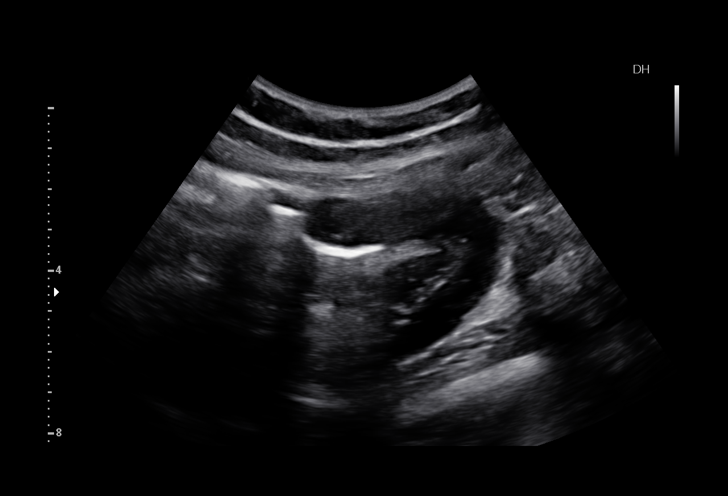
[im 37/46]
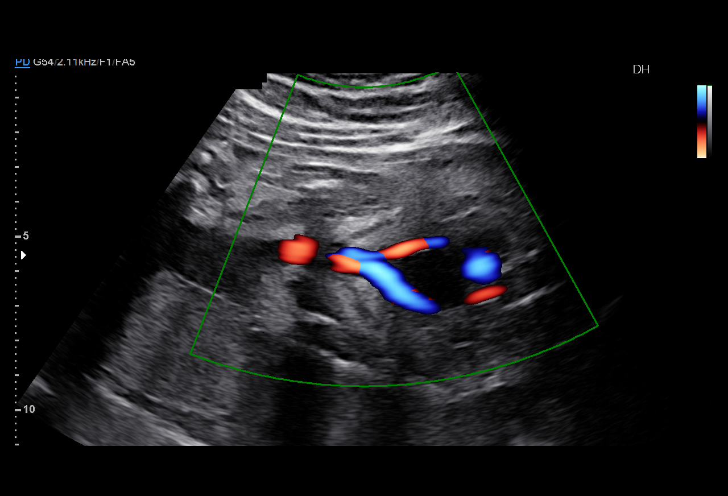
[im 41/46]
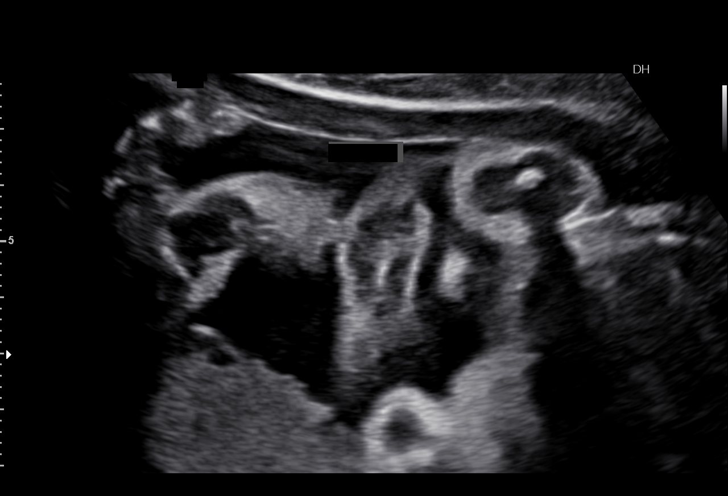
[im 44/46]
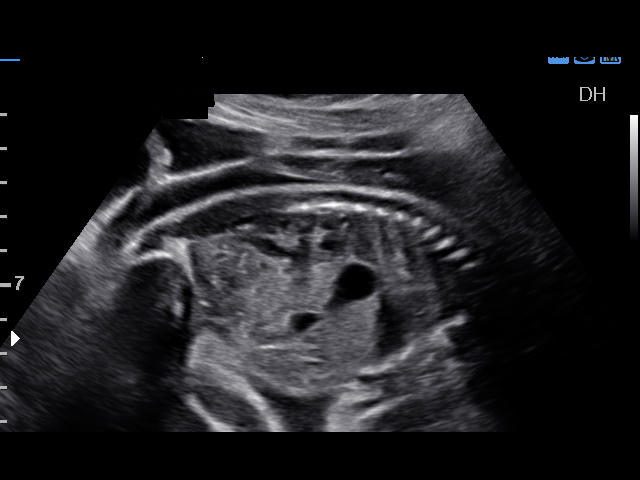

[13 of 28 positions shown; findings below may reference images not displayed]

am)

Name:       SAKEO NAKELI                          Visit  08/21/2015 [DATE]
Date:

[facility]UMPER                                       OBGYN
[REDACTED]
Darry,
Ref. Address:      [address]
[REDACTED]

1  De Sousa Cadete Arensdorf          [id number]        [id number]     [id number]
2  Nik          [id number]        [id number]     [id number]
3  ROSEANN KOLODZIEJ          [id number]        [id number]     [id number]
Indications

30 weeks gestation of pregnancy
Maternal care for known or suspected poor
fetal growth, third trimester, not applicable
or unspecified
Previous cesarean delivery, antepartum
Family history of genetic disorder
(muscular dystrophy)
Poor obstetric history: Previous fetal
growth restriction (FGR)
Poor obstetric history: Previous
preeclampsia
BMZ [DATE], [DATE]
OB History

Gravidity:     3         Term:  1        Prem:    0        SAB:   1
TOP:           0       Ectopic  0        Living:  1
:
Fetal Evaluation

Num Of Fetuses:      1
Fetal Heart          158
Rate(bpm):
Cardiac Activity:    Observed
Presentation:        Cephalic
Placenta:            Posterior, above cervical os

Amniotic Fluid
AFI FV:      Subjectively within normal limits
AFI Sum:     10.88    cm      21  %Tile     Larg Pckt:    3.79   cm
RUQ:   3.73    cm    RLQ:   1.02    cm   LUQ:    2.34    cm   LLQ:    3.79   cm
Biophysical Evaluation

Amniotic F.V:   Pocket => 2 cm two          F. Tone:        Observed
planes
F. Movement:    Observed                    Score:          [DATE]
F. Breathing:   Observed
Biometry

BPD:      71.8  mm     G. Age:   28w 6d                  CI:        77.49   %    70 - 86
FL/HC:      22.0   %    19.2 -
HC:      258.2  mm     G. Age:   28w 0d       < 3   %    HC/AC:      1.06        0.99 -
AC:      244.2  mm     G. Age:   28w 5d          9  %    FL/BPD      79.1   %    71 - 87
:
FL:       56.8  mm     G. Age:   29w 6d        23   %    FL/AC:      23.3   %    20 - 24
HUM:      49.9  mm     G. Age:   29w 2d        31   %
CER:      35.6  mm     G. Age:   30w 4d        54   %
CM:        5.3  mm

Est.        6063   gm   2 lb 15 oz      27   %
FW:
Gestational Age

LMP:           30w 2d        Date:  01/21/15                  EDD:   10/28/15
U/S Today:     28w 6d                                         EDD:   11/07/15
Best:          30w 2d    Det. By:   LMP  (01/21/15)           EDD:   10/28/15
Anatomy

Cranium:          Appears normal         Ductal Arch:       Previously seen
Fetal Cavum:      Appears normal         Diaphragm:         Previously seen
Ventricles:       Appears normal         Stomach:           Appears normal,
left sided
Nuchal Fold:      Not applicable (>20    Abdomen:           Appears normal
wks GA)
Face:             Appears normal         Abdominal          Appears nml (cord
(orbits and profile)   Wall:              insert, abd wall)
Lips:             Appears normal         Cord Vessels:      Appears normal (3
vessel cord)
Heart:            Previously seen        Kidneys:           Appear normal
RVOT:             Previously seen        Bladder:           Appears normal
LVOT:             Previously seen        Upper              Previously seen
Extremities:
Aortic Arch:      Previously seen        Lower              Previously seen
Extremities:

Other:   Complete fetal anatomic survey previously performed at [facility].
Technically difficult due to advanced GA and fetal position.
Doppler - Fetal Vessels

Umbilical Artery
S/D     %tile                                            ADFV     RDFV
3.15        65                                                No      No

Cervix Uterus Adnexa

Cervix
Not visualized (advanced GA >65wks)

Left Ovary
Within normal limits.

Right Ovary
Within normal limits.

Adnexa:       No abnormality visualized.
Impression

Single IUP at 30w 2d
Follow up ultrasound due to lagging growth
The overall estimated fetal weight today is at the 27th
%tile.  The AC measures at the 9th %tile.
BPP [DATE]
UA Doppler studies are normal for gestational age.  No
AEDF or REDF noted
Normal amniotic fluid volume
Recommendations

Recommend weekly BPPs and UA Doppler studies
Ultrasound for growth in 3 weeks

## 2016-01-28 ENCOUNTER — Ambulatory Visit (INDEPENDENT_AMBULATORY_CARE_PROVIDER_SITE_OTHER): Payer: BC Managed Care – PPO | Admitting: Physician Assistant

## 2016-01-28 VITALS — BP 120/80 | HR 67 | Temp 97.8°F | Resp 17 | Ht 66.0 in | Wt 182.0 lb

## 2016-01-28 DIAGNOSIS — F4321 Adjustment disorder with depressed mood: Secondary | ICD-10-CM | POA: Diagnosis not present

## 2016-01-28 DIAGNOSIS — F411 Generalized anxiety disorder: Secondary | ICD-10-CM

## 2016-01-28 DIAGNOSIS — F4329 Adjustment disorder with other symptoms: Secondary | ICD-10-CM

## 2016-01-28 DIAGNOSIS — G4700 Insomnia, unspecified: Secondary | ICD-10-CM | POA: Diagnosis not present

## 2016-01-28 DIAGNOSIS — Z634 Disappearance and death of family member: Principal | ICD-10-CM

## 2016-01-28 DIAGNOSIS — F4381 Prolonged grief disorder: Secondary | ICD-10-CM

## 2016-01-28 MED ORDER — HYDROXYZINE HCL 25 MG PO TABS
25.0000 mg | ORAL_TABLET | Freq: Three times a day (TID) | ORAL | Status: AC | PRN
Start: 2016-01-28 — End: ?

## 2016-01-28 MED ORDER — SERTRALINE HCL 50 MG PO TABS: 75.0000 mg | ORAL_TABLET | Freq: Every day | ORAL | Status: AC

## 2016-01-28 NOTE — Patient Instructions (Addendum)
     IF you received an x-ray today, you will receive an invoice from [facility]. Please contact [facility] at [phone] with questions or concerns regarding your invoice.   IF you received labwork today, you will receive an invoice from [facility]. Please contact [facility] at [phone] with questions or concerns regarding your invoice.   Our billing staff will not be able to assist you with questions regarding bills from these companies.  You will be contacted with the lab results as soon as they are available. The fastest way to get your results is to activate your My Chart account. Instructions are located on the last page of this paperwork. If you have not heard from us regarding the results in 2 weeks, please contact this office.    Please increase to 1.5 tablet.  You can take 1-2 tablets of the hydroxyzine at night.  Let me know how this works. I would like you to follow up with the counselor and increase your visits.  You will let me know if you need a referral.   Try to keep a journal of your feelings.  Write in it nightly.
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
## 2016-02-07 IMAGING — US US MFM FETAL BPP W/O NON-STRESS
1 series · 15 of 28 positions shown · non-contrast
Comparison: none

[Series 1: us mfm fetal bpp w/o non-stress · 33 acquisitions, 15 frames shown]
[im 1/33]
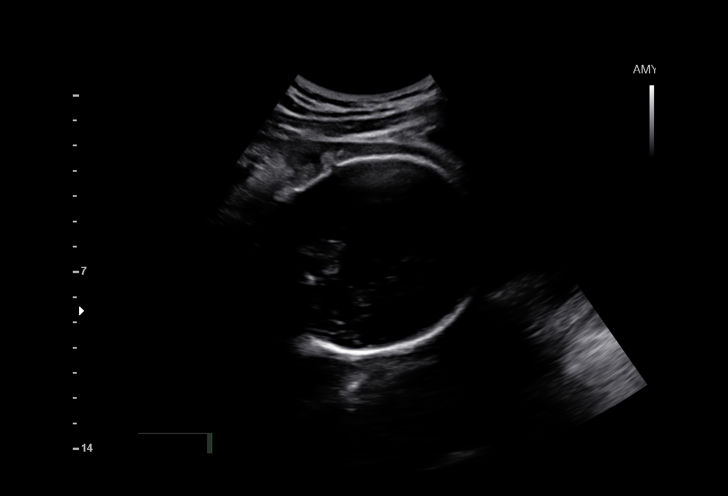
[im 3/33]
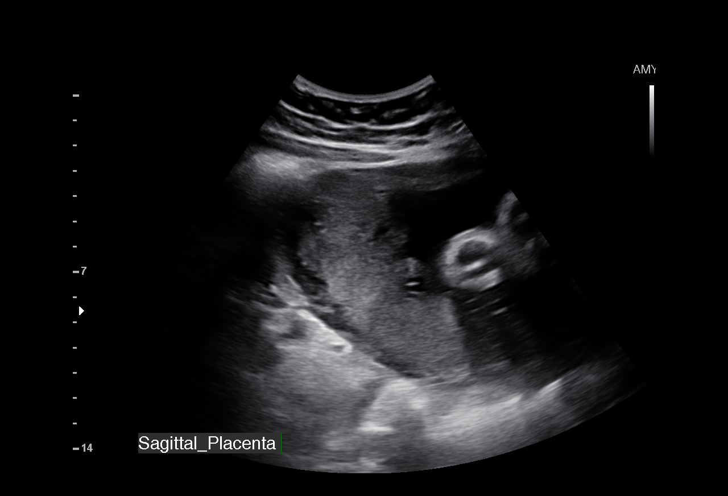
[im 5/33]
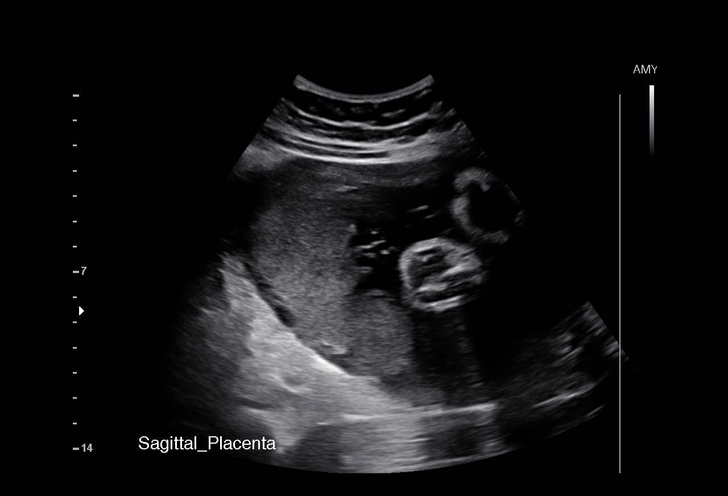
[im 8/33]
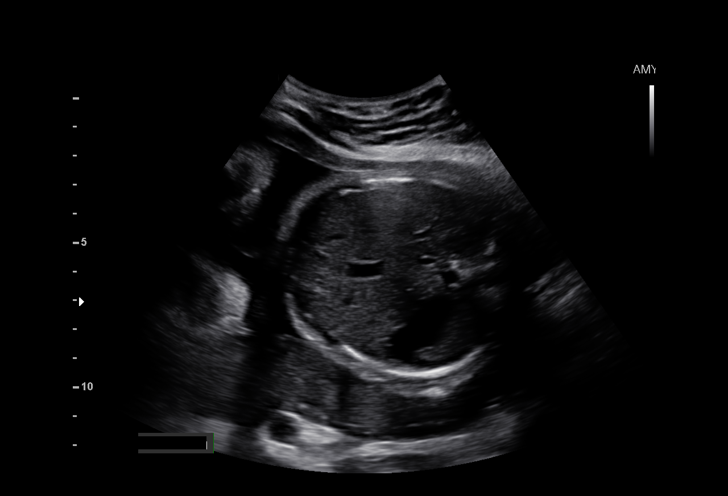
[im 10/33]
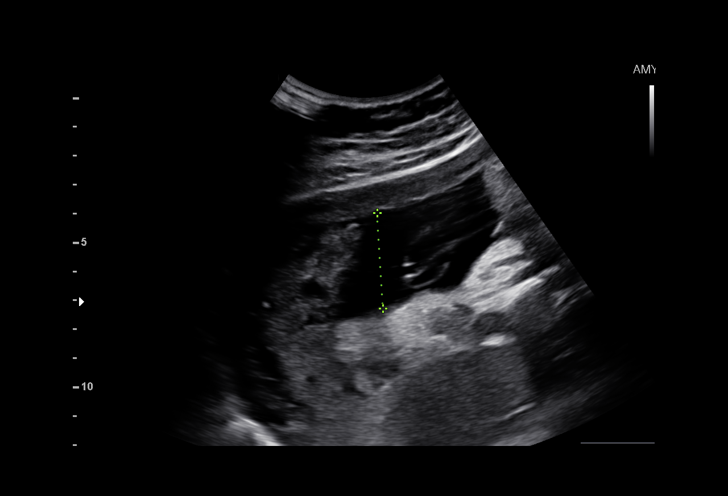
[im 12/33]
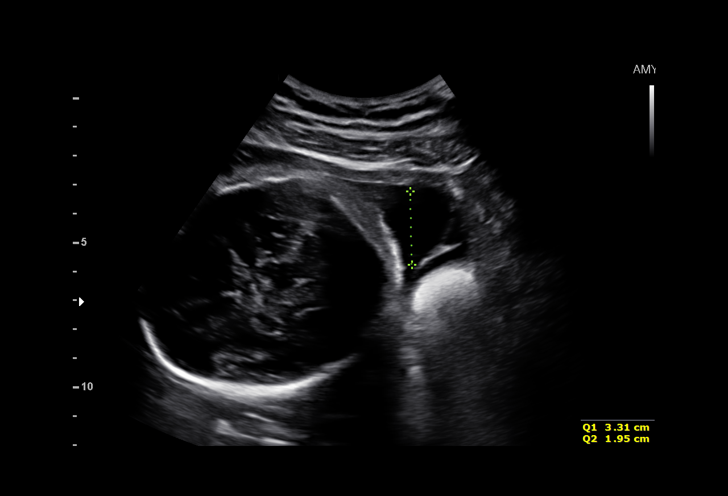
[im 15/33]
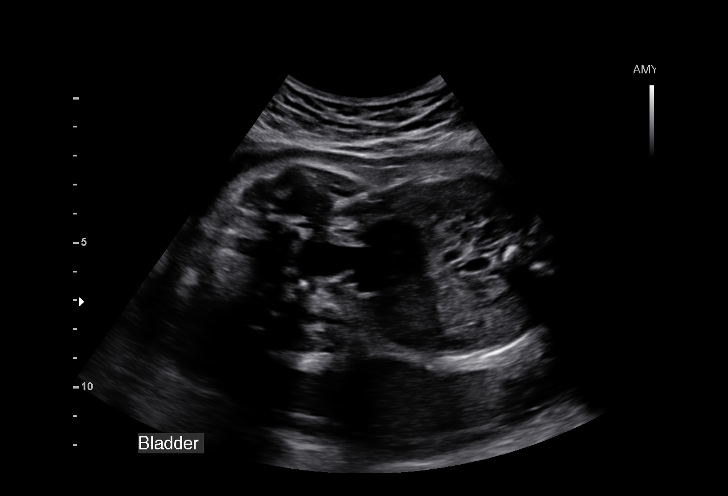
[im 17/33]
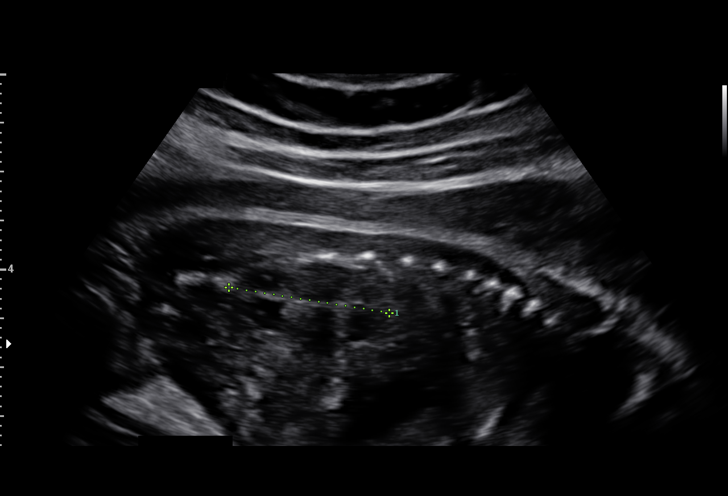
[im 18/33]
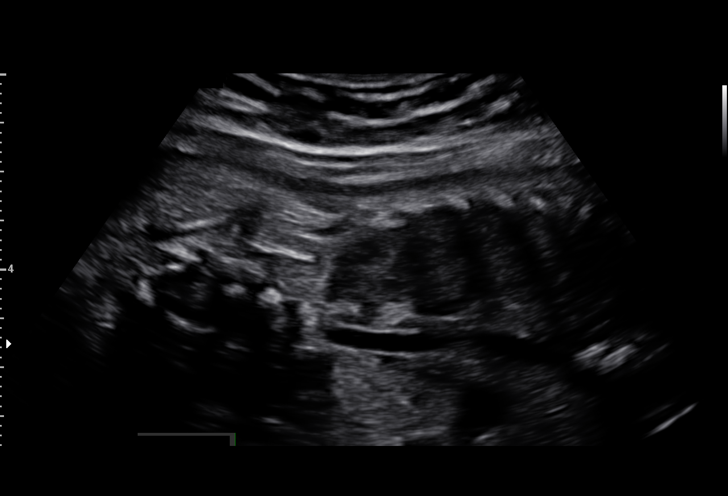
[im 21/33]
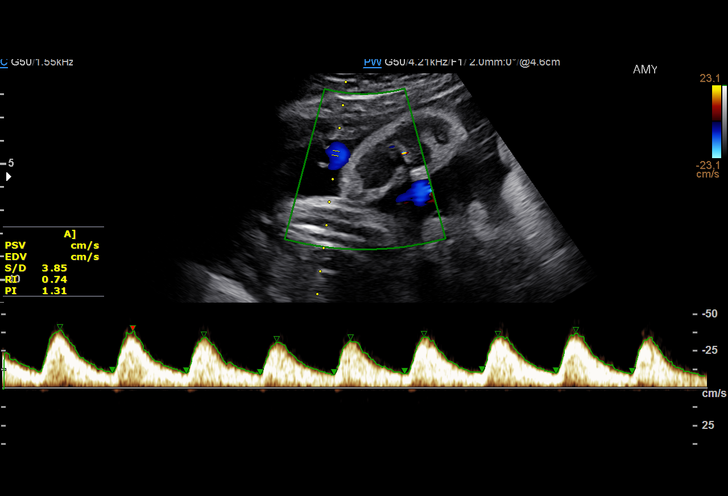
[im 23/33]
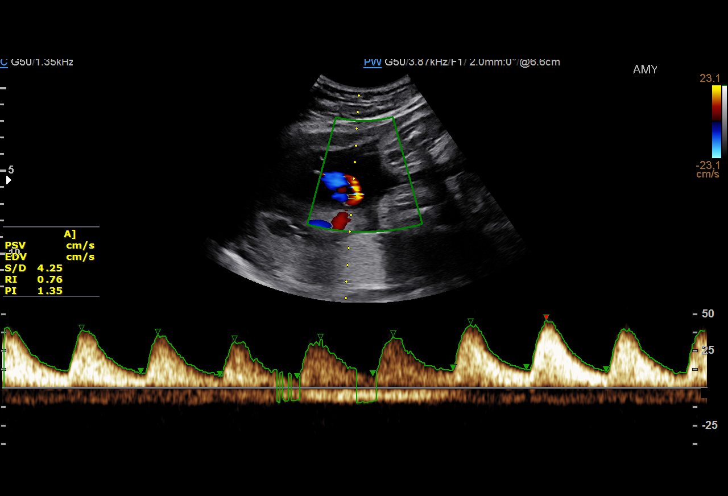
[im 25/33]
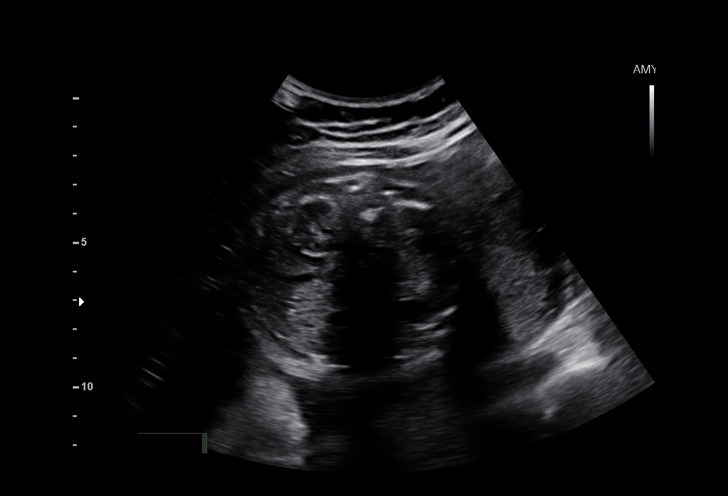
[im 28/33]
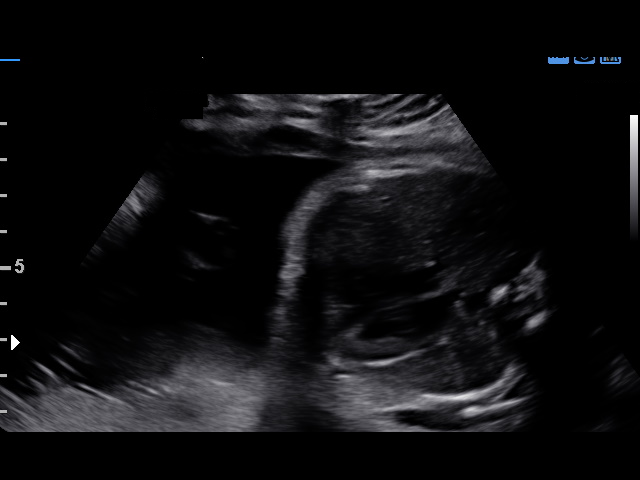
[im 30/33]
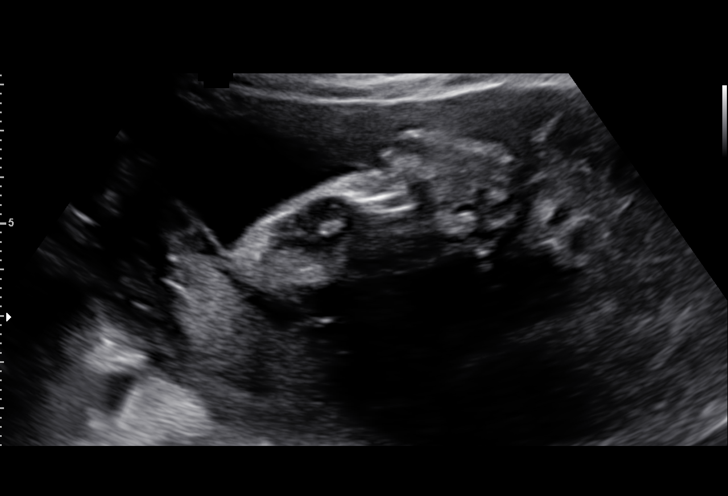
[im 33/33]
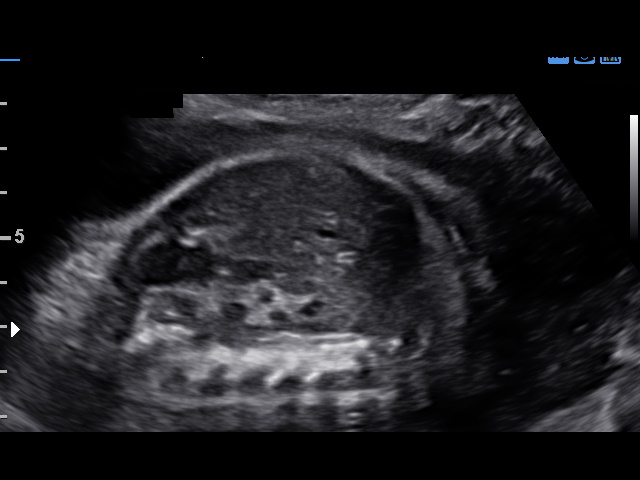

[15 of 28 positions shown; findings below may reference images not displayed]

am)

Name:       JOUBATSAN UNELUS                          Visit  09/04/2015 [DATE]
Date:

OBGYN
[REDACTED]
Bashiie,
Ref. Address:      [address]
[REDACTED]

1  Hoq Pagaduan            [id number]       [id number]     [id number]
2  Mirka Maya            [id number]       [id number]     [id number]
Indications

Maternal care for known or suspected poor
fetal growth, third trimester, not applicable
or unspecified; small AC
Previous cesarean delivery, antepartum
Family history of genetic disorder
(muscular dystrophy)
Poor obstetric history: Previous fetal
growth restriction (FGR)
Poor obstetric history: Previous
preeclampsia
BMZ [DATE], [DATE]
32 weeks gestation of pregnancy
OB History

Gravidity:     3         Term:  1        Prem:    0        SAB:   1
TOP:           0       Ectopic  0        Living:  1
:
Fetal Evaluation

Num Of Fetuses:      1
Fetal Heart          158
Rate(bpm):
Cardiac Activity:    Observed
Presentation:        Cephalic
Placenta:            Posterior, above cervical os
P. Cord Insertion:   Visualized

Amniotic Fluid
AFI FV:      Subjectively within normal limits
AFI Sum:     7.8      cm       3  %Tile     Larg Pckt:    3.31   cm
RUQ:   3.31    cm    LUQ:   1.95    cm   LLQ:    2.54    cm
Biophysical Evaluation

Amniotic F.V:   Within normal limits        F. Tone:        Observed
F. Movement:    Observed                    Score:          [DATE]
F. Breathing:   Observed
Gestational Age

LMP:           32w 2d        Date:  01/21/15                  EDD:   10/28/15
Best:          32w 2d    Det. By:   LMP  (01/21/15)           EDD:   10/28/15
Doppler - Fetal Vessels

Umbilical Artery
S/D     %tile
3.83        94

Cervix Uterus Adnexa

Cervix
Not visualized (advanced GA >07wks)
Impression

Singleton intrauterine pregnancy at 32 weeks 2 days
gestation with fetal cardiac activity
Cephalic presentation
BPP [DATE] with an AFI >7cm
UA Doppler
Recommendations

Repeat testing in one week
Growth in 1 week

## 2016-06-07 ENCOUNTER — Ambulatory Visit: Payer: BC Managed Care – PPO

## 2016-06-11 ENCOUNTER — Ambulatory Visit: Payer: BC Managed Care – PPO

## 2016-06-11 ENCOUNTER — Telehealth: Payer: Self-pay | Admitting: Emergency Medicine

## 2016-06-11 ENCOUNTER — Telehealth: Payer: Self-pay

## 2016-06-11 NOTE — Telephone Encounter (Signed)
Pt is needing to talk with someone about her depression issues and being related to her attendance at work   She is scheduled for today at 500 to be seen if necessary but would like to possibly speak with someone about this and not have to come in   [employer] number [phone]

## 2016-06-11 NOTE — Telephone Encounter (Signed)
Spoke with patient and informed she will need to keep 5pm appointment to discuss depression needs with provider before work accommodations can be made.

## 2016-06-11 NOTE — Telephone Encounter (Signed)
Attempted to reach patient. Number listed VM not set up
